# Patient Record
Sex: Female | Born: 1975 | Race: Black or African American | Hispanic: No | Marital: Single | Smoking: Former smoker
Health system: Southern US, Community
[De-identification: ages and names within clinical notes are randomized; demographics above are authoritative.]

## PROBLEM LIST (undated history)

## (undated) DIAGNOSIS — G43909 Migraine, unspecified, not intractable, without status migrainosus: Secondary | ICD-10-CM

## (undated) HISTORY — PX: HEMORRHOID SURGERY: SHX153

## (undated) HISTORY — PX: WISDOM TOOTH EXTRACTION: SHX21

---

## 1998-02-26 ENCOUNTER — Other Ambulatory Visit: Admission: RE | Admit: 1998-02-26 | Discharge: 1998-02-26 | Payer: Self-pay | Admitting: Family Medicine

## 1998-09-02 ENCOUNTER — Encounter: Admission: RE | Admit: 1998-09-02 | Discharge: 1998-09-02 | Payer: Self-pay | Admitting: Family Medicine

## 1998-09-06 ENCOUNTER — Encounter: Admission: RE | Admit: 1998-09-06 | Discharge: 1998-09-06 | Payer: Self-pay | Admitting: Family Medicine

## 1998-10-14 ENCOUNTER — Encounter: Admission: RE | Admit: 1998-10-14 | Discharge: 1998-10-14 | Payer: Self-pay | Admitting: Family Medicine

## 1998-10-28 ENCOUNTER — Encounter: Admission: RE | Admit: 1998-10-28 | Discharge: 1998-10-28 | Payer: Self-pay | Admitting: Family Medicine

## 1998-12-18 ENCOUNTER — Encounter: Admission: RE | Admit: 1998-12-18 | Discharge: 1998-12-18 | Payer: Self-pay | Admitting: Family Medicine

## 1998-12-23 ENCOUNTER — Encounter: Admission: RE | Admit: 1998-12-23 | Discharge: 1998-12-23 | Payer: Self-pay | Admitting: Family Medicine

## 1999-05-19 ENCOUNTER — Emergency Department (HOSPITAL_COMMUNITY): Admission: EM | Admit: 1999-05-19 | Discharge: 1999-05-19 | Payer: Self-pay | Admitting: Emergency Medicine

## 2000-08-20 ENCOUNTER — Encounter: Admission: RE | Admit: 2000-08-20 | Discharge: 2000-08-20 | Payer: Self-pay | Admitting: Family Medicine

## 2000-08-20 ENCOUNTER — Other Ambulatory Visit: Admission: RE | Admit: 2000-08-20 | Discharge: 2000-08-20 | Payer: Self-pay | Admitting: Family Medicine

## 2000-09-23 ENCOUNTER — Encounter (INDEPENDENT_AMBULATORY_CARE_PROVIDER_SITE_OTHER): Payer: Self-pay | Admitting: *Deleted

## 2000-09-23 ENCOUNTER — Ambulatory Visit (HOSPITAL_BASED_OUTPATIENT_CLINIC_OR_DEPARTMENT_OTHER): Admission: RE | Admit: 2000-09-23 | Discharge: 2000-09-24 | Payer: Self-pay | Admitting: *Deleted

## 2001-08-22 ENCOUNTER — Encounter: Admission: RE | Admit: 2001-08-22 | Discharge: 2001-08-22 | Payer: Self-pay | Admitting: Family Medicine

## 2001-08-25 ENCOUNTER — Encounter: Admission: RE | Admit: 2001-08-25 | Discharge: 2001-08-25 | Payer: Self-pay | Admitting: Family Medicine

## 2001-09-21 ENCOUNTER — Encounter: Admission: RE | Admit: 2001-09-21 | Discharge: 2001-09-21 | Payer: Self-pay | Admitting: Family Medicine

## 2001-09-23 ENCOUNTER — Encounter: Admission: RE | Admit: 2001-09-23 | Discharge: 2001-09-23 | Payer: Self-pay | Admitting: Sports Medicine

## 2001-09-23 ENCOUNTER — Encounter: Payer: Self-pay | Admitting: Sports Medicine

## 2001-10-22 ENCOUNTER — Emergency Department (HOSPITAL_COMMUNITY): Admission: EM | Admit: 2001-10-22 | Discharge: 2001-10-22 | Payer: Self-pay | Admitting: Emergency Medicine

## 2002-05-29 ENCOUNTER — Encounter: Admission: RE | Admit: 2002-05-29 | Discharge: 2002-05-29 | Payer: Self-pay | Admitting: Family Medicine

## 2002-05-29 ENCOUNTER — Other Ambulatory Visit: Admission: RE | Admit: 2002-05-29 | Discharge: 2002-05-29 | Payer: Self-pay | Admitting: Family Medicine

## 2003-01-17 ENCOUNTER — Encounter: Admission: RE | Admit: 2003-01-17 | Discharge: 2003-01-17 | Payer: Self-pay | Admitting: Family Medicine

## 2003-10-04 ENCOUNTER — Other Ambulatory Visit: Admission: RE | Admit: 2003-10-04 | Discharge: 2003-10-04 | Payer: Self-pay | Admitting: Family Medicine

## 2003-10-04 ENCOUNTER — Encounter: Admission: RE | Admit: 2003-10-04 | Discharge: 2003-10-04 | Payer: Self-pay | Admitting: Sports Medicine

## 2003-11-19 ENCOUNTER — Encounter: Admission: RE | Admit: 2003-11-19 | Discharge: 2003-11-19 | Payer: Self-pay | Admitting: Sports Medicine

## 2004-01-29 ENCOUNTER — Encounter: Admission: RE | Admit: 2004-01-29 | Discharge: 2004-01-29 | Payer: Self-pay | Admitting: Sports Medicine

## 2004-08-02 ENCOUNTER — Emergency Department (HOSPITAL_COMMUNITY): Admission: EM | Admit: 2004-08-02 | Discharge: 2004-08-02 | Payer: Self-pay | Admitting: Family Medicine

## 2004-09-09 ENCOUNTER — Other Ambulatory Visit: Admission: RE | Admit: 2004-09-09 | Discharge: 2004-09-09 | Payer: Self-pay | Admitting: Family Medicine

## 2004-09-09 ENCOUNTER — Ambulatory Visit: Payer: Self-pay | Admitting: Family Medicine

## 2004-11-19 ENCOUNTER — Ambulatory Visit: Payer: Self-pay | Admitting: Family Medicine

## 2004-11-21 ENCOUNTER — Ambulatory Visit: Payer: Self-pay | Admitting: Family Medicine

## 2005-04-26 ENCOUNTER — Emergency Department (HOSPITAL_COMMUNITY): Admission: EM | Admit: 2005-04-26 | Discharge: 2005-04-26 | Payer: Self-pay | Admitting: Emergency Medicine

## 2005-09-07 ENCOUNTER — Emergency Department (HOSPITAL_COMMUNITY): Admission: EM | Admit: 2005-09-07 | Discharge: 2005-09-08 | Payer: Self-pay | Admitting: Emergency Medicine

## 2005-09-24 ENCOUNTER — Encounter (INDEPENDENT_AMBULATORY_CARE_PROVIDER_SITE_OTHER): Payer: Self-pay | Admitting: *Deleted

## 2005-09-24 LAB — CONVERTED CEMR LAB

## 2005-10-12 ENCOUNTER — Ambulatory Visit: Payer: Self-pay | Admitting: Family Medicine

## 2005-12-07 ENCOUNTER — Ambulatory Visit: Payer: Self-pay | Admitting: Family Medicine

## 2006-04-07 ENCOUNTER — Ambulatory Visit: Payer: Self-pay | Admitting: Family Medicine

## 2006-10-21 DIAGNOSIS — L2089 Other atopic dermatitis: Secondary | ICD-10-CM

## 2006-10-22 ENCOUNTER — Encounter (INDEPENDENT_AMBULATORY_CARE_PROVIDER_SITE_OTHER): Payer: Self-pay | Admitting: *Deleted

## 2007-01-21 ENCOUNTER — Telehealth: Payer: Self-pay | Admitting: *Deleted

## 2007-03-21 ENCOUNTER — Encounter (INDEPENDENT_AMBULATORY_CARE_PROVIDER_SITE_OTHER): Payer: Self-pay | Admitting: Family Medicine

## 2007-03-21 ENCOUNTER — Other Ambulatory Visit: Admission: RE | Admit: 2007-03-21 | Discharge: 2007-03-21 | Payer: Self-pay | Admitting: Family Medicine

## 2007-03-21 ENCOUNTER — Ambulatory Visit: Payer: Self-pay | Admitting: Family Medicine

## 2007-03-21 DIAGNOSIS — R635 Abnormal weight gain: Secondary | ICD-10-CM | POA: Insufficient documentation

## 2007-03-21 LAB — CONVERTED CEMR LAB: Pap Smear: NORMAL

## 2007-03-24 ENCOUNTER — Telehealth (INDEPENDENT_AMBULATORY_CARE_PROVIDER_SITE_OTHER): Payer: Self-pay | Admitting: Family Medicine

## 2007-04-15 ENCOUNTER — Telehealth (INDEPENDENT_AMBULATORY_CARE_PROVIDER_SITE_OTHER): Payer: Self-pay | Admitting: Family Medicine

## 2007-06-02 ENCOUNTER — Ambulatory Visit: Payer: Self-pay | Admitting: Family Medicine

## 2007-06-02 DIAGNOSIS — E663 Overweight: Secondary | ICD-10-CM | POA: Insufficient documentation

## 2007-06-02 DIAGNOSIS — J309 Allergic rhinitis, unspecified: Secondary | ICD-10-CM | POA: Insufficient documentation

## 2007-06-06 ENCOUNTER — Telehealth (INDEPENDENT_AMBULATORY_CARE_PROVIDER_SITE_OTHER): Payer: Self-pay | Admitting: Family Medicine

## 2007-06-17 ENCOUNTER — Encounter (INDEPENDENT_AMBULATORY_CARE_PROVIDER_SITE_OTHER): Payer: Self-pay | Admitting: Family Medicine

## 2008-07-05 ENCOUNTER — Encounter: Payer: Self-pay | Admitting: Family Medicine

## 2008-07-05 ENCOUNTER — Ambulatory Visit: Payer: Self-pay | Admitting: Family Medicine

## 2008-07-05 DIAGNOSIS — R634 Abnormal weight loss: Secondary | ICD-10-CM

## 2008-07-05 DIAGNOSIS — K625 Hemorrhage of anus and rectum: Secondary | ICD-10-CM

## 2008-07-05 DIAGNOSIS — M25569 Pain in unspecified knee: Secondary | ICD-10-CM

## 2008-07-05 DIAGNOSIS — J020 Streptococcal pharyngitis: Secondary | ICD-10-CM

## 2008-07-05 DIAGNOSIS — B49 Unspecified mycosis: Secondary | ICD-10-CM

## 2008-07-15 ENCOUNTER — Encounter: Payer: Self-pay | Admitting: Family Medicine

## 2010-09-24 ENCOUNTER — Encounter: Payer: Self-pay | Admitting: *Deleted

## 2011-01-09 NOTE — Op Note (Signed)
[place]. [facility]  Patient:    Erica Burnett, Erica Burnett                          MRN: [id number] Proc. Date: 09/23/00 Adm. Date:  [id number] Attending:  Stephenie Acres                           Operative Report  PREOPERATIVE DIAGNOSIS:  Grade 2 to 3 internal hemorrhoids.  POSTOPERATIVE DIAGNOSIS:  Grade 2 to 3 internal hemorrhoids.  OPERATION PERFORMED:  A two-quadrant hemorrhoidectomy.  SURGEON:  Stephenie Acres, M.D.  ANESTHESIA:  General.  DESCRIPTION OF PROCEDURE:  The patient was taken to the operating room and placed in supine position.  After adequate anesthesia was induced using endotracheal tube, the patient was placed in the prone jackknife position. Perianal prep was undertaken.  Using 9 cc of Marcaine diluted with 1 cc of Wydase, the right posterior hemorrhoidal bundle which was very large was injected.  It was grasped and retracted to the anoderm.  An incision was made around the hemorrhoidal bundle and dissection was taken to remove the hemorrhoidal bundle to the underlying musculature.  The apex was grasped with a Bowie clamp and the hemorrhoidal bundle was removed.  The defect was closed in a mucosa to muscle to mucosa interlocking 2-0 chromic fashion up to the anoderm.  The defect in the anoderm was then closed with a 3-0 chromic suture. A similar procedure was used in the left lateral hemorrhoidal bundle.  All tissues were injected using 0.5% Marcaine.  A four-quadrant injection was also undertaken.  A large Gelfoam packing was placed in the rectum.  The patient tolerated the procedure well and went to PACU in good condition. DD:  09/23/00 TD:  09/24/00 Job: [id number]

## 2011-06-04 ENCOUNTER — Encounter: Payer: Self-pay | Admitting: Family Medicine

## 2011-06-16 ENCOUNTER — Encounter: Payer: Self-pay | Admitting: Family Medicine

## 2011-06-16 ENCOUNTER — Other Ambulatory Visit: Payer: Self-pay | Admitting: Family Medicine

## 2011-06-16 ENCOUNTER — Ambulatory Visit (INDEPENDENT_AMBULATORY_CARE_PROVIDER_SITE_OTHER): Payer: Managed Care, Other (non HMO) | Admitting: Family Medicine

## 2011-06-16 ENCOUNTER — Other Ambulatory Visit (HOSPITAL_COMMUNITY)
Admission: RE | Admit: 2011-06-16 | Discharge: 2011-06-16 | Disposition: A | Discharge status: Home / Self Care | Payer: Managed Care, Other (non HMO) | Source: Ambulatory Visit | Attending: Family Medicine | Admitting: Family Medicine

## 2011-06-16 DIAGNOSIS — Z13220 Encounter for screening for lipoid disorders: Secondary | ICD-10-CM

## 2011-06-16 DIAGNOSIS — Z01419 Encounter for gynecological examination (general) (routine) without abnormal findings: Secondary | ICD-10-CM | POA: Insufficient documentation

## 2011-06-16 DIAGNOSIS — Z23 Encounter for immunization: Secondary | ICD-10-CM

## 2011-06-16 DIAGNOSIS — Z124 Encounter for screening for malignant neoplasm of cervix: Secondary | ICD-10-CM

## 2011-06-16 DIAGNOSIS — N760 Acute vaginitis: Secondary | ICD-10-CM

## 2011-06-16 LAB — LIPID PANEL
Cholesterol: 161 mg/dL (ref 0–200)
HDL: 41 mg/dL (ref >39)
Total CHOL/HDL Ratio: 3.9 Ratio
Triglycerides: 60 mg/dL (ref <150)
VLDL: 12 mg/dL (ref 0–40)

## 2011-06-16 MED ORDER — IBUPROFEN 800 MG PO TABS: 800.0000 mg | ORAL_TABLET | Freq: Three times a day (TID) | ORAL | Status: AC | PRN

## 2011-06-16 MED ORDER — BETAMETHASONE VALERATE 0.1 % EX OINT: TOPICAL_OINTMENT | Freq: Every day | CUTANEOUS | Status: AC

## 2011-06-16 NOTE — Patient Instructions (Signed)

## 2011-06-16 NOTE — Progress Notes (Signed)
Subjective:    Patient ID: Erica Burnett, female    DOB: 1976/03/19, 35 y.o.   MRN: [id number]  HPI 1. CPE Doing well, complete history and physical performed.  2. 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 Between her web spaces on right foot. Pruritis noted.  3. Eczema - buttocks 2 week history of pruritic eczematous patch on right buttock. Using hydrocortisone with little relief. Uses a very drying soap: irish spring.  No evidence of infection or fungal rash.  4. Weight management Has a personal trainer, frequent exercise, diet plan. She has lost 40 lbs per pt.  5. HTN Diastolic HTN 90. She has no headache/visual changes. She has started to decrease her salt intake.  6. Tobacco abuse She quit smoking 3 years ago. However, two times a month when she drinks alcohol she smokes a few cigarettes. Counseled for 15 minutes  7. Influenza vaccine counseling Counseled for 10 minutes. Discussed the risks and benefits of having the vaccine. She will think about it. Does not want it now.  Review of Systems  Constitutional: Positive for activity change. Negative for fever, fatigue and unexpected weight change.  HENT: Negative for congestion and sore throat.   Eyes: Negative for visual disturbance.  Respiratory: Negative for shortness of breath.   Cardiovascular: Negative for chest pain and palpitations.  Gastrointestinal: Positive for blood in stool. Negative for nausea, abdominal pain, diarrhea, constipation, abdominal distention and rectal pain.  Genitourinary: Positive for vaginal discharge and vaginal pain. Negative for dysuria, vaginal bleeding and pelvic pain.  Musculoskeletal: Negative for myalgias.  Skin: Positive for rash.  Neurological: Negative for syncope.  Psychiatric/Behavioral: Negative for dysphoric mood.       Objective:   Physical Exam  Nursing note and vitals reviewed. Constitutional: She appears well-developed and well-nourished. No  distress.  HENT:  Head: Normocephalic and atraumatic.  Neck: Neck supple. No JVD present. No tracheal deviation present. No thyromegaly present.  Cardiovascular: Normal rate, regular rhythm and normal heart sounds.   No murmur heard. Pulmonary/Chest: Effort normal and breath sounds normal. No respiratory distress. She has no wheezes. She has no rales. She exhibits no tenderness.  Abdominal: Soft. She exhibits no distension and no mass. There is no tenderness. There is no rebound and no guarding.  Genitourinary: Vagina normal and uterus normal. No vaginal discharge found.  Musculoskeletal: She exhibits no edema.  Lymphadenopathy:    She has no cervical adenopathy.  Skin: Skin is warm. No rash noted. She is not diaphoretic.  Psychiatric: She has a normal mood and affect.       Assessment & Plan:  1. CPE Doing well, complete history and physical performed.  2. 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 Between her web spaces on right foot. Pruritis noted.  3. Eczema - buttocks 2 week history of pruritic eczematous patch on right buttock. Using hydrocortisone with little relief. Uses a very drying soap: irish spring.  No evidence of infection or fungal rash.  4. Weight management Has a personal trainer, frequent exercise, diet plan. She has lost 40 lbs per pt.  5. HTN Diastolic HTN 90. She has no headache/visual changes. She has started to decrease her salt intake.  6. Tobacco abuse She quit smoking 3 years ago. However, two times a month when she drinks alcohol she smokes a few cigarettes. Counseled for 15 minutes  7. Influenza vaccine counseling Counseled for 10 minutes. Discussed the risks and benefits of having the vaccine.  She will think about it. Does not want it now.

## 2011-06-16 NOTE — Telephone Encounter (Signed)
Left message about WET prep results

## 2011-06-17 ENCOUNTER — Telehealth: Payer: Self-pay | Admitting: Family Medicine

## 2011-06-17 NOTE — Telephone Encounter (Signed)
Discussed lipid panel results with patient. Plan to continue diet modification and exercise

## 2011-06-19 ENCOUNTER — Encounter: Payer: Self-pay | Admitting: Family Medicine

## 2013-01-18 ENCOUNTER — Emergency Department (HOSPITAL_BASED_OUTPATIENT_CLINIC_OR_DEPARTMENT_OTHER)
Admission: EM | Admit: 2013-01-18 | Discharge: 2013-01-18 | Disposition: A | Discharge status: Home / Self Care | Payer: Managed Care, Other (non HMO) | Attending: Emergency Medicine | Admitting: Emergency Medicine

## 2013-01-18 ENCOUNTER — Encounter (HOSPITAL_BASED_OUTPATIENT_CLINIC_OR_DEPARTMENT_OTHER): Payer: Self-pay | Admitting: Family Medicine

## 2013-01-18 DIAGNOSIS — F17200 Nicotine dependence, unspecified, uncomplicated: Secondary | ICD-10-CM | POA: Insufficient documentation

## 2013-01-18 DIAGNOSIS — L309 Dermatitis, unspecified: Secondary | ICD-10-CM

## 2013-01-18 DIAGNOSIS — L259 Unspecified contact dermatitis, unspecified cause: Secondary | ICD-10-CM | POA: Insufficient documentation

## 2013-01-18 MED ORDER — CLOTRIMAZOLE-BETAMETHASONE 1-0.05 % EX CREA: TOPICAL_CREAM | CUTANEOUS | Status: DC

## 2013-01-18 NOTE — ED Notes (Signed)
Pt c/o unknown rash to right hand x wks.

## 2013-01-18 NOTE — ED Provider Notes (Signed)
History     CSN: [id number]  Arrival date & time 01/18/13  0806   None     No chief complaint on file.   (Consider location/radiation/quality/duration/timing/severity/associated sxs/prior treatment) HPI Comments: Patient presents with complaints of rash to the back of her right hand for the past two weeks.  It is itchy and has popped up in several places.  She has tried otc cortisone cream which has not helped.  Patient is a 37 y.o. female presenting with rash. The history is provided by the patient.  Rash Pain location: right hand. Pain quality comment:  No pain Pain severity:  No pain Onset quality:  Gradual Duration:  2 weeks Timing:  Constant Progression:  Worsening Chronicity:  New Relieved by:  Nothing Ineffective treatments: otc hydrocortisone.   History reviewed. No pertinent past medical history.  History reviewed. No pertinent past surgical history.  No family history on file.  History  Substance Use Topics  . Smoking status: Current Some Day Smoker    Types: Cigarettes  . Smokeless tobacco: Former Neurosurgeon    Quit date: 06/16/2007  . Alcohol Use: No    OB History   Grav Para Term Preterm Abortions TAB SAB Ect Mult Living                  Review of Systems  Skin: Positive for rash.  All other systems reviewed and are negative.    Allergies  Review of patient's allergies indicates no known allergies.  Home Medications   Current Outpatient Rx  Name  Route  Sig  Dispense  Refill  . fluticasone (VERAMYST) 27.5 MCG/SPRAY nasal spray   Nasal   1 spray by Nasal route daily.           [name] loratadine (CLARITIN) 10 MG tablet   Oral   Take 10 mg by mouth daily.             BP 129/79  Pulse 76  Temp(Src) 98.5 F (36.9 C) (Oral)  Resp 18  Ht 5' (1.524 m)  Wt 160 lb (72.576 kg)  BMI 31.25 kg/m2  SpO2 100%  LMP 12/19/2012  Physical Exam  Nursing note and vitals reviewed. Constitutional: She is oriented to person, place, and time. She  appears well-developed and well-nourished. No distress.  HENT:  Head: Normocephalic and atraumatic.  Mouth/Throat: Oropharynx is clear and moist.  Neck: Normal range of motion. Neck supple.  Neurological: She is alert and oriented to person, place, and time.  Skin: She is not diaphoretic.  There are several 1 cm round, annular, raised lesions to the dorsum of the right hand.  They are pruritic in nature.  No drainage or erythema.    ED Course  Procedures (including critical care time)  Labs Reviewed - No data to display No results found.   No diagnosis found.    MDM  Possible fungal etiology.  Will treat with lotrisone, follow up prn.        Geoffery Lyons, MD 01/18/13 [phone]

## 2013-03-03 ENCOUNTER — Encounter (HOSPITAL_BASED_OUTPATIENT_CLINIC_OR_DEPARTMENT_OTHER): Payer: Self-pay | Admitting: Family Medicine

## 2013-03-03 ENCOUNTER — Emergency Department (HOSPITAL_BASED_OUTPATIENT_CLINIC_OR_DEPARTMENT_OTHER): Payer: Managed Care, Other (non HMO)

## 2013-03-03 ENCOUNTER — Emergency Department (HOSPITAL_BASED_OUTPATIENT_CLINIC_OR_DEPARTMENT_OTHER)
Admission: EM | Admit: 2013-03-03 | Discharge: 2013-03-03 | Disposition: A | Discharge status: Home / Self Care | Payer: Managed Care, Other (non HMO) | Attending: Emergency Medicine | Admitting: Emergency Medicine

## 2013-03-03 DIAGNOSIS — R071 Chest pain on breathing: Secondary | ICD-10-CM | POA: Insufficient documentation

## 2013-03-03 DIAGNOSIS — R1013 Epigastric pain: Secondary | ICD-10-CM | POA: Insufficient documentation

## 2013-03-03 DIAGNOSIS — R110 Nausea: Secondary | ICD-10-CM | POA: Insufficient documentation

## 2013-03-03 DIAGNOSIS — F17200 Nicotine dependence, unspecified, uncomplicated: Secondary | ICD-10-CM | POA: Insufficient documentation

## 2013-03-03 DIAGNOSIS — R0789 Other chest pain: Secondary | ICD-10-CM

## 2013-03-03 LAB — COMPREHENSIVE METABOLIC PANEL
ALT: 11 U/L (ref 0–35)
AST: 12 U/L (ref 0–37)
Albumin: 4 g/dL (ref 3.5–5.2)
Calcium: 10.8 mg/dL — ABNORMAL HIGH (ref 8.4–10.5)
GFR calc Af Amer: >90 mL/min (ref >90)
Sodium: 138 mEq/L (ref 135–145)
Total Protein: 7.9 g/dL (ref 6.0–8.3)

## 2013-03-03 LAB — CBC WITH DIFFERENTIAL/PLATELET
Basophils Absolute: 0 10*3/uL (ref 0.0–0.1)
Basophils Relative: 1 % (ref 0–1)
Eosinophils Absolute: 0.1 10*3/uL (ref 0.0–0.7)
Eosinophils Relative: 2 % (ref 0–5)
MCH: 26.9 pg (ref 26.0–34.0)
MCV: 80 fL (ref 78.0–100.0)
Platelets: 327 10*3/uL (ref 150–400)
RDW: 14.5 % (ref 11.5–15.5)
WBC: 7.4 10*3/uL (ref 4.0–10.5)

## 2013-03-03 MED ORDER — LANSOPRAZOLE 30 MG PO CPDR: 30.0000 mg | DELAYED_RELEASE_CAPSULE | Freq: Every day | ORAL | Status: DC

## 2013-03-03 MED ORDER — ASPIRIN 81 MG PO CHEW
324.0000 mg | CHEWABLE_TABLET | Freq: Once | ORAL | Status: AC
  Administered 2013-03-03: 324 mg via ORAL
  Filled 2013-03-03: qty 4

## 2013-03-03 MED ORDER — GI COCKTAIL ~~LOC~~
30.0000 mL | Freq: Once | ORAL | Status: DC
  Filled 2013-03-03: qty 30

## 2013-03-03 MED ORDER — HYDROCODONE-ACETAMINOPHEN 5-325 MG PO TABS: 2.0000 | ORAL_TABLET | Freq: Four times a day (QID) | ORAL | Status: DC | PRN

## 2013-03-03 NOTE — Discharge Instructions (Signed)
Abdominal Pain (Nonspecific) Your exam might not show the exact reason you have abdominal pain. Since there are many different causes of abdominal pain, another checkup and more tests may be needed. It is very important to follow up for lasting (persistent) or worsening symptoms. A possible cause of abdominal pain in any person who still has his or her appendix is acute appendicitis. Appendicitis is often hard to diagnose. Normal blood tests, urine tests, ultrasound, and CT scans do not completely rule out early appendicitis or other causes of abdominal pain. Sometimes, only the changes that happen over time will allow appendicitis and other causes of abdominal pain to be determined. Other potential problems that may require surgery may also take time to become more apparent. Because of this, it is important that you follow all of the instructions below. HOME CARE INSTRUCTIONS   Rest as much as possible.  Do not eat solid food until your pain is gone.  While adults or children have pain: A diet of water, weak decaffeinated tea, broth or bouillon, gelatin, oral rehydration solutions (ORS), frozen ice pops, or ice chips may be helpful.  When pain is gone in adults or children: Start a light diet (dry toast, crackers, applesauce, or white rice). Increase the diet slowly as long as it does not bother you. Eat no dairy products (including cheese and eggs) and no spicy, fatty, fried, or high-fiber foods.  Use no alcohol, caffeine, or cigarettes.  Take your regular medicines unless your caregiver told you not to.  Take any prescribed medicine as directed.  Only take over-the-counter or prescription medicines for pain, discomfort, or fever as directed by your caregiver. Do not give aspirin to children. If your caregiver has given you a follow-up appointment, it is very important to keep that appointment. Not keeping the appointment could result in a permanent injury and/or lasting (chronic) pain and/or  disability. If there is any problem keeping the appointment, you must call to reschedule.  SEEK IMMEDIATE MEDICAL CARE IF:   Your pain is not gone in 24 hours.  Your pain becomes worse, changes location, or feels different.  You or your child has an oral temperature above 102 F (38.9 C), not controlled by medicine.  Your baby is older than 3 months with a rectal temperature of 102 F (38.9 C) or higher.  Your baby is 75 months old or younger with a rectal temperature of 100.4 F (38 C) or higher.  You have shaking chills.  You keep throwing up (vomiting) or cannot drink liquids.  There is blood in your vomit or you see blood in your bowel movements.  Your bowel movements become dark or black.  You have frequent bowel movements.  Your bowel movements stop (become blocked) or you cannot pass gas.  You have bloody, frequent, or painful urination.  You have yellow discoloration in the skin or whites of the eyes.  Your stomach becomes bloated or bigger.  You have dizziness or fainting.  You have chest or back pain. MAKE SURE YOU:   Understand these instructions.  Will watch your condition.  Will get help right away if you are not doing well or get worse. Document Released: 08/10/2005 Document Revised: 11/02/2011 Document Reviewed: 07/08/2009 [facility] Patient Information 2014 [place], [place].  Chest Wall Pain Chest wall pain is pain in or around the bones and muscles of your chest. It may take up to 6 weeks to get better. It may take longer if you must stay physically active in  your work and activities.  CAUSES  Chest wall pain may happen on its own. However, it may be caused by:  A viral illness like the flu.  Injury.  Coughing.  Exercise.  Arthritis.  Fibromyalgia.  Shingles. HOME CARE INSTRUCTIONS   Avoid overtiring physical activity. Try not to strain or perform activities that cause pain. This includes any activities using your chest or your abdominal  and side muscles, especially if heavy weights are used.  Put ice on the sore area.  Put ice in a plastic bag.  Place a towel between your skin and the bag.  Leave the ice on for 15-20 minutes per hour while awake for the first 2 days.  Only take over-the-counter or prescription medicines for pain, discomfort, or fever as directed by your caregiver. SEEK IMMEDIATE MEDICAL CARE IF:   Your pain increases, or you are very uncomfortable.  You have a fever.  Your chest pain becomes worse.  You have new, unexplained symptoms.  You have nausea or vomiting.  You feel sweaty or lightheaded.  You have a cough with phlegm (sputum), or you cough up blood. MAKE SURE YOU:   Understand these instructions.  Will watch your condition.  Will get help right away if you are not doing well or get worse. Document Released: 08/10/2005 Document Revised: 11/02/2011 Document Reviewed: 04/06/2011 [facility] Patient Information 2014 [place], [place].

## 2013-03-03 NOTE — ED Provider Notes (Signed)
History    CSN: [id number] Arrival date & time 03/03/13  1229  First MD Initiated Contact with Patient 03/03/13 1254     Chief Complaint  Patient presents with  . Chest Pain   (Consider location/radiation/quality/duration/timing/severity/associated sxs/prior Treatment) Patient is a 37 y.o. female presenting with chest pain.  Chest Pain  Pt reports 3 days of aching pain in lower chest/xyphoid and epigastric area, initially she thought it was gas and had some improvement with OTC remedies. She reports pain has been worse since yesterday in particular with deep breath and movement. No change with eating. Occasional nausea, but no vomiting, diarrhea or fever. She has some improvement with lying prone in the bed too. No known CAD. No HTN, DM. PERC neg.   History reviewed. No pertinent past medical history. History reviewed. No pertinent past surgical history. No family history on file. History  Substance Use Topics  . Smoking status: Current Some Day Smoker    Types: Cigarettes  . Smokeless tobacco: Former Neurosurgeon    Quit date: 06/16/2007  . Alcohol Use: No   OB History   Grav Para Term Preterm Abortions TAB SAB Ect Mult Living                 Review of Systems  Cardiovascular: Positive for chest pain.   All other systems reviewed and are negative except as noted in HPI.   Allergies  Review of patient's allergies indicates no known allergies.  Home Medications   Current Outpatient Rx  Name  Route  Sig  Dispense  Refill  . clotrimazole-betamethasone (LOTRISONE) cream      Apply to affected area 2 times daily prn   15 g   1   . fluticasone (VERAMYST) 27.5 MCG/SPRAY nasal spray   Nasal   1 spray by Nasal route daily.           [name] loratadine (CLARITIN) 10 MG tablet   Oral   Take 10 mg by mouth daily.            BP 138/86  Pulse 98  Temp(Src) 98.1 F (36.7 C) (Oral)  Resp 20  Ht 5' (1.524 m)  Wt 160 lb (72.576 kg)  BMI 31.25 kg/m2  SpO2 100%  LMP  02/19/2013 Physical Exam  Nursing note and vitals reviewed. Constitutional: She is oriented to person, place, and time. She appears well-developed and well-nourished.  HENT:  Head: Normocephalic and atraumatic.  Eyes: EOM are normal. Pupils are equal, round, and reactive to light.  Neck: Normal range of motion. Neck supple.  Cardiovascular: Normal rate, normal heart sounds and intact distal pulses.   Pulmonary/Chest: Effort normal and breath sounds normal. She has no wheezes. She has no rales. She exhibits no tenderness.  Abdominal: Bowel sounds are normal. She exhibits no distension. There is tenderness (mild, epigastric). There is no rebound and no guarding.  Musculoskeletal: Normal range of motion. She exhibits no edema and no tenderness.  Neurological: She is alert and oriented to person, place, and time. She has normal strength. No cranial nerve deficit or sensory deficit.  Skin: Skin is warm and dry. No rash noted.  Psychiatric: She has a normal mood and affect.    ED Course  Procedures (including critical care time) Labs Reviewed  COMPREHENSIVE METABOLIC PANEL - Abnormal; Notable for the following:    Calcium 10.8 (*)    All other components within normal limits  CBC WITH DIFFERENTIAL  LIPASE, BLOOD  TROPONIN I   Dg  Abd Acute W/chest  03/03/2013   *RADIOLOGY REPORT*  Clinical Data: Epigastric pain, chest pain  ACUTE ABDOMEN SERIES (ABDOMEN 2 VIEW & CHEST 1 VIEW)  Comparison: None.  Findings: Cardiomediastinal silhouette is unremarkable.  No acute infiltrate or pleural effusion.  No pulmonary edema.  There is nonspecific nonobstructive bowel gas pattern.  Moderate stool noted in the right colon and left colon.  Pelvic phleboliths are noted.  No free abdominal air.  IMPRESSION: 1.  No acute disease.  Nonspecific nonobstructive bowel gas pattern.  No free abdominal air.   Original Report Authenticated By: Natasha Mead, M.D.   1. Chest wall pain   2. Epigastric pain     MDM   Date:  03/03/2013  Rate: 86  Rhythm: normal sinus rhythm  QRS Axis: normal  Intervals: normal  ST/T Wave abnormalities: normal  Conduction Disutrbances:none  Narrative Interpretation:   Old EKG Reviewed: none available  Labs and imaging reviewed and normal. Pt having lower chest wall vs epigastric pain from unclear etiology but unlikely to be ACS/CAD, PE or acute surgical process. Will d/c with pain meds and PPI, Advised PCP followup.     Charles B. Bernette Mayers, MD 03/03/13 1422

## 2013-03-03 NOTE — ED Notes (Signed)
Pt c/o generalized chest pain x 3 days, denies shob, n/v. Pt sts pain worse with movement and pain relieved when lying flat.

## 2014-06-19 ENCOUNTER — Telehealth: Payer: Self-pay | Admitting: Family Medicine

## 2014-06-19 ENCOUNTER — Other Ambulatory Visit (HOSPITAL_COMMUNITY)
Admission: RE | Admit: 2014-06-19 | Discharge: 2014-06-19 | Disposition: A | Discharge status: Home / Self Care | Payer: Managed Care, Other (non HMO) | Source: Ambulatory Visit | Attending: Family Medicine | Admitting: Family Medicine

## 2014-06-19 ENCOUNTER — Encounter: Payer: Self-pay | Admitting: Family Medicine

## 2014-06-19 ENCOUNTER — Ambulatory Visit (INDEPENDENT_AMBULATORY_CARE_PROVIDER_SITE_OTHER): Payer: Managed Care, Other (non HMO) | Admitting: Family Medicine

## 2014-06-19 VITALS — BP 142/87 | HR 98 | Temp 98.3°F | Ht 60.0 in | Wt 202.0 lb

## 2014-06-19 DIAGNOSIS — Z01419 Encounter for gynecological examination (general) (routine) without abnormal findings: Secondary | ICD-10-CM | POA: Insufficient documentation

## 2014-06-19 DIAGNOSIS — Z0000 Encounter for general adult medical examination without abnormal findings: Secondary | ICD-10-CM | POA: Insufficient documentation

## 2014-06-19 DIAGNOSIS — Z1151 Encounter for screening for human papillomavirus (HPV): Secondary | ICD-10-CM | POA: Diagnosis present

## 2014-06-19 DIAGNOSIS — R202 Paresthesia of skin: Secondary | ICD-10-CM | POA: Insufficient documentation

## 2014-06-19 DIAGNOSIS — N898 Other specified noninflammatory disorders of vagina: Secondary | ICD-10-CM

## 2014-06-19 DIAGNOSIS — L309 Dermatitis, unspecified: Secondary | ICD-10-CM

## 2014-06-19 DIAGNOSIS — Z131 Encounter for screening for diabetes mellitus: Secondary | ICD-10-CM

## 2014-06-19 LAB — CBC WITH DIFFERENTIAL/PLATELET
Basophils Absolute: 0 10*3/uL (ref 0.0–0.1)
Basophils Relative: 0 % (ref 0–1)
Eosinophils Absolute: 0.2 10*3/uL (ref 0.0–0.7)
Eosinophils Relative: 3 % (ref 0–5)
HEMATOCRIT: 34.4 % — AB (ref 36.0–46.0)
HEMOGLOBIN: 11.1 g/dL — AB (ref 12.0–15.0)
LYMPHS ABS: 2 10*3/uL (ref 0.7–4.0)
Lymphocytes Relative: 32 % (ref 12–46)
MCH: 25.2 pg — ABNORMAL LOW (ref 26.0–34.0)
MCHC: 32.3 g/dL (ref 30.0–36.0)
MCV: 78.2 fL (ref 78.0–100.0)
MONO ABS: 0.4 10*3/uL (ref 0.1–1.0)
MONOS PCT: 6 % (ref 3–12)
NEUTROS ABS: 3.8 10*3/uL (ref 1.7–7.7)
NEUTROS PCT: 59 % (ref 43–77)
Platelets: 371 10*3/uL (ref 150–400)
RBC: 4.4 MIL/uL (ref 3.87–5.11)
RDW: 15.4 % (ref 11.5–15.5)
WBC: 6.4 10*3/uL (ref 4.0–10.5)

## 2014-06-19 LAB — LIPID PANEL
Cholesterol: 152 mg/dL (ref 0–200)
HDL: 39 mg/dL — AB (ref >39)
LDL CALC: 96 mg/dL (ref 0–99)
TRIGLYCERIDES: 86 mg/dL (ref <150)
Total CHOL/HDL Ratio: 3.9 Ratio
VLDL: 17 mg/dL (ref 0–40)

## 2014-06-19 LAB — BASIC METABOLIC PANEL
BUN: 8 mg/dL (ref 6–23)
CALCIUM: 8.8 mg/dL (ref 8.4–10.5)
CHLORIDE: 104 meq/L (ref 96–112)
CO2: 22 meq/L (ref 19–32)
CREATININE: 0.66 mg/dL (ref 0.50–1.10)
GLUCOSE: 113 mg/dL — AB (ref 70–99)
Potassium: 4.1 mEq/L (ref 3.5–5.3)
SODIUM: 138 meq/L (ref 135–145)

## 2014-06-19 LAB — TSH: TSH: 0.688 u[IU]/mL (ref 0.350–4.500)

## 2014-06-19 LAB — POCT WET PREP (WET MOUNT): CLUE CELLS WET PREP WHIFF POC: POSITIVE

## 2014-06-19 LAB — VITAMIN B12: Vitamin B-12: 562 pg/mL (ref 211–911)

## 2014-06-19 LAB — POCT GLYCOSYLATED HEMOGLOBIN (HGB A1C): HEMOGLOBIN A1C: 5.5

## 2014-06-19 MED ORDER — CLOBETASOL PROPIONATE 0.05 % EX CREA: 1.0000 "application " | TOPICAL_CREAM | Freq: Two times a day (BID) | CUTANEOUS | Status: DC

## 2014-06-19 MED ORDER — IBUPROFEN 800 MG PO TABS: 800.0000 mg | ORAL_TABLET | Freq: Three times a day (TID) | ORAL | Status: DC | PRN

## 2014-06-19 MED ORDER — METRONIDAZOLE 500 MG PO TABS
500.0000 mg | ORAL_TABLET | Freq: Two times a day (BID) | ORAL | Status: AC
Start: 2014-06-19 — End: 2014-06-29

## 2014-06-19 NOTE — Assessment & Plan Note (Addendum)
Wet prep done showing BV and Trichomonas. Will treat with Metronidazole  for 7 days to cover both BV and Trich I called to discuss result with her message left for her to call back.

## 2014-06-19 NOTE — Progress Notes (Signed)
Patient ID: Erica Burnett, female   DOB: 1976-01-08, 38 y.o.   MRN: [id number] Subjective:     Erica Burnett is a 38 y.o. female and is here for a comprehensive physical exam. The patient reports problems - Skin rash on arm, hand, legs b/L for more than 1 year on and off, she had used ttopical steroid in the past. B/L upper and lower limb numbness and tingling mostly at night she is fine during the day and vaginal discharge.[name]  History   Social History  . Marital Status: Single    Spouse Name: N/A    Number of Children: N/A  . Years of Education: N/A   Occupational History  . Not on file.   Social History Main Topics  . Smoking status: Former Smoker    Types: Cigarettes  . Smokeless tobacco: Former Systems developer    Quit date: 06/16/2007  . Alcohol Use: No  . Drug Use: No  . Sexual Activity: Not on file   Other Topics Concern  . Not on file   Social History Narrative  . No narrative on file   Health Maintenance  Topic Date Due  . Pap Smear  06/15/2014  . Influenza Vaccine  11/22/2014 (Originally 03/24/2014)  . Tetanus/tdap  06/15/2021  OB/GYN Hx Patient's last menstrual period was 06/07/2014. Contraception: none .At times uses condoms.  Last Pap: 2012. Results were: normal Last mammogram: none, scheduled for next week. Results were: NA G3P2  The following portions of the patient's history were reviewed and updated as appropriate: allergies, current medications, past family history, past medical history, past social history, past surgical history and problem list.  Review of Systems Pertinent items are noted in HPI.   Objective:    BP 142/87  Pulse 98  Temp(Src) 98.3 F (36.8 C) (Oral)  Ht 5' (1.524 m)  Wt 202 lb (91.627 kg)  BMI 39.45 kg/m2  LMP 06/07/2014 General appearance: alert and cooperative Head: Normocephalic, without obvious abnormality, atraumatic Eyes: conjunctivae/corneas clear. PERRL, EOM's intact. Fundi benign. Ears: normal TM's and external ear canals both  ears and Right ear cerumen impaction otherwise normal. Nose: Nares normal. Septum midline. Mucosa normal. No drainage or sinus tenderness. Throat: lips, mucosa, and tongue normal; teeth and gums normal Neck: no adenopathy, no carotid bruit, no JVD, supple, symmetrical, trachea midline and thyroid not enlarged, symmetric, no tenderness/mass/nodules Lungs: clear to auscultation bilaterally Heart: regular rate and rhythm, S1, S2 normal, no murmur, click, rub or gallop Abdomen: soft, non-tender; bowel sounds normal; no masses,  no organomegaly Pelvic: cervix normal in appearance, external genitalia normal, no adnexal masses or tenderness, no cervical motion tenderness, rectovaginal septum normal, uterus normal size, shape, and consistency and whitish vaginal discharge Extremities: extremities normal, atraumatic, no cyanosis or edema Skin: Dry scaly,patchy hyperpigmented lesion on forearm, dorsum of her hand B/L, her left and right shin Neurologic: Alert and oriented X 3, normal strength and tone. Normal symmetric reflexes. Normal coordination and gait    Assessment:    Healthy female exam. PAP done Skin rash Paresthesia Vaginal discharge      Plan:     See After Visit Summary for Counseling Recommendations  Check problem list.

## 2014-06-19 NOTE — Progress Notes (Deleted)
Patient ID: Erica Burnett, female   DOB: March 05, 1976, 38 y.o.   MRN: [id number] Subjective:     Erica Burnett is a 38 y.o. female here for a routine exam.  Current complaints: Skin rash for 1 year. Numbness every night for the last few month Personal health questionnaire reviewed: yes.   Gynecologic History Patient's last menstrual period was 06/07/2014. Contraception: none .At times uses condoms.  Last Pap: 2012. Results were: normal Last mammogram: none, scheduled for next week. Results were: NA G3P2  Obstetric History OB History  No data available     The following portions of the patient's history were reviewed and updated as appropriate: allergies, current medications, past family history, past medical history, past social history, past surgical history and problem list.  Review of Systems {ros; complete:30496}    Objective:    {exam; complete:18323}    Assessment:    Healthy female exam.    Plan:    {plan:19193}

## 2014-06-19 NOTE — Assessment & Plan Note (Signed)
Unspecific rash. 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 Will try high potency steroid. I recommended biopsy if no improvement at the derm clinic.

## 2014-06-19 NOTE — Assessment & Plan Note (Addendum)
Normal exam except for skin rash, vaginal discharge. PAP done today, I will call her with result. GC/Chlamydia done since she is sexually active. Birth control counseling done, she declined birth Bernie Covey prefers condoms. She declined flu shot. She will schedule her mammogram within few days. FLP and BMET checked today.

## 2014-06-19 NOTE — Patient Instructions (Signed)
Pap Test A Pap test is a procedure done in a clinic office to evaluate cells that are on the surface of the cervix. The cervix is the lower portion of the uterus and upper portion of the vagina. For some women, the cervical region has the potential to form cancer. With consistent evaluations by your caregiver, this type of cancer can be prevented.  If a Pap test is abnormal, it is most often a result of a previous exposure to human papillomavirus (HPV). HPV is a virus that can infect the cells of the cervix and cause dysplasia. Dysplasia is where the cells no longer look normal. If a woman has been diagnosed with high-grade or severe dysplasia, they are at higher risk of developing cervical cancer. People diagnosed with low-grade dysplasia should still be seen by their caregiver because there is a small chance that low-grade dysplasia could develop into cancer.  LET YOUR CAREGIVER KNOW ABOUT:  Recent sexually transmitted infection (STI) you have had.  Any new sex partners you have had.  History of previous abnormal Pap tests results.  History of previous cervical procedures you have had (colposcopy, biopsy, loop electrosurgical excision procedure [LEEP]).  Concerns you have had regarding unusual vaginal discharge.  History of pelvic pain.  Your use of birth control. BEFORE THE PROCEDURE  Ask your caregiver when to schedule your Pap test. It is best not to be on your period if your caregiver uses a wooden spatula to collect cells or applies cells to a glass slide. Newer techniques are not so sensitive to the timing of a menstrual cycle.  Do not douche or have sexual intercourse for 24 hours before the test.   Do not use vaginal creams or tampons for 24 hours before the test.   Empty your bladder just before the test to lessen any discomfort.  PROCEDURE You will lie on an exam table with your feet in stirrups. A warm metal or plastic instrument (speculum) is placed in your vagina. This  instrument allows your caregiver to see the inside of your vagina and look at your cervix. A small, plastic brush or wooden spatula is then used to collect cervical cells. These cells are placed in a lab specimen container. The cells are looked at under a microscope. A specialist will determine if the cells are normal.  AFTER THE PROCEDURE Make sure to get your test results.If your results come back abnormal, you may need further testing.  Document Released: 10/31/2002 Document Revised: 11/02/2011 Document Reviewed: 08/06/2011 ExitCare Patient Information 2015 [employer]. This information is not intended to replace advice given to you by your health care provider. Make sure you discuss any questions you have with your health care provider.  

## 2014-06-19 NOTE — Assessment & Plan Note (Signed)
No sensory deficit on exam. R/O Metabolic cause. BMET, TSH, CBC, Vit B12 and D checked today. A1C also checked to screen for DM. I will call with test result and discuss further management.

## 2014-06-19 NOTE — Telephone Encounter (Signed)
I called about her wet prep which was positive for BV and Trichomoniasis, Instruction given to call back and or pick up prescription at the pharmacy.

## 2014-06-20 ENCOUNTER — Other Ambulatory Visit: Payer: Self-pay | Admitting: Family Medicine

## 2014-06-20 LAB — CYTOLOGY - PAP

## 2014-06-20 LAB — VITAMIN D 25 HYDROXY (VIT D DEFICIENCY, FRACTURES): VIT D 25 HYDROXY: 14 ng/mL — AB (ref 30–89)

## 2014-06-20 MED ORDER — VITAMIN D (ERGOCALCIFEROL) 1.25 MG (50000 UNIT) PO CAPS: 50000.0000 [IU] | ORAL_CAPSULE | ORAL | Status: DC

## 2014-06-20 NOTE — Telephone Encounter (Signed)
Discussed test result. Trichomoniasis and BV as well as low Vit. Treatment recommendation discussed with her. All questions were answered.

## 2014-06-21 ENCOUNTER — Encounter: Payer: Self-pay | Admitting: Family Medicine

## 2014-06-26 ENCOUNTER — Telehealth: Payer: Self-pay | Admitting: Family Medicine

## 2014-06-26 ENCOUNTER — Other Ambulatory Visit: Payer: Self-pay | Admitting: Family Medicine

## 2014-06-26 MED ORDER — CLOTRIMAZOLE-BETAMETHASONE 1-0.05 % EX CREA: 1.0000 "application " | TOPICAL_CREAM | Freq: Two times a day (BID) | CUTANEOUS | Status: DC

## 2014-06-26 NOTE — Telephone Encounter (Signed)
Pt seen by [place] on 10/27. Was prescribed TEMOVATE 0.05 % and it cost $126. Patient requesting an alternative med to be called in. Was prescribed LOTRISONE cream some time ago, and it only cost $18. Please make patient aware once new meds are sent.

## 2014-06-26 NOTE — Telephone Encounter (Signed)
Please inform patient her refill has been completed.

## 2014-06-26 NOTE — Telephone Encounter (Signed)
LMOVM "Dr. Gwendlyn Deutscher changed the Rx as requested". Monia Timmers, Salome Spotted

## 2014-07-02 ENCOUNTER — Telehealth: Payer: Self-pay | Admitting: Family Medicine

## 2014-07-02 NOTE — Telephone Encounter (Signed)
Pt called because the creams called in for her skin issues is very expensive. Can we call in something on the 4.00 list at [facility]. jw

## 2014-07-03 ENCOUNTER — Other Ambulatory Visit: Payer: Self-pay | Admitting: Family Medicine

## 2014-07-03 MED ORDER — TRIAMCINOLONE ACETONIDE 0.1 % EX CREA: 1.0000 "application " | TOPICAL_CREAM | Freq: Two times a day (BID) | CUTANEOUS | Status: DC

## 2014-07-03 NOTE — Telephone Encounter (Signed)
I have e-scribed triamcinolone. Please let patient know this.

## 2014-07-04 NOTE — Telephone Encounter (Signed)
LMOVM informing pt that "rx was changed again, please give [place] a call back is there is still an issue". Clinton Sawyer, Salome Spotted

## 2015-06-20 ENCOUNTER — Other Ambulatory Visit: Payer: Self-pay | Admitting: Family Medicine

## 2015-11-26 ENCOUNTER — Telehealth: Payer: Self-pay | Admitting: Family Medicine

## 2015-11-26 NOTE — Telephone Encounter (Signed)
Ms. Erica Burnett is not pleased that she have to be reassigned to a resident.  Want to speak with you regarding this.

## 2015-12-02 ENCOUNTER — Other Ambulatory Visit: Payer: Self-pay | Admitting: *Deleted

## 2015-12-02 MED ORDER — TRIAMCINOLONE ACETONIDE 0.1 % EX CREA: 1.0000 "application " | TOPICAL_CREAM | Freq: Two times a day (BID) | CUTANEOUS | Status: DC

## 2015-12-02 NOTE — Telephone Encounter (Signed)
Patient called stating she is having a bad flare up of eczema on her hands.  She has an upcoming well woman exam on 12/18/2015 with Dr. Ardelia Mems.  Derl Barrow, RN

## 2015-12-18 ENCOUNTER — Ambulatory Visit (INDEPENDENT_AMBULATORY_CARE_PROVIDER_SITE_OTHER): Payer: Managed Care, Other (non HMO) | Admitting: Family Medicine

## 2015-12-18 ENCOUNTER — Encounter: Payer: Self-pay | Admitting: Family Medicine

## 2015-12-18 VITALS — BP 132/76 | HR 90 | Temp 98.3°F | Ht 60.0 in | Wt 198.0 lb

## 2015-12-18 DIAGNOSIS — E010 Iodine-deficiency related diffuse (endemic) goiter: Secondary | ICD-10-CM

## 2015-12-18 DIAGNOSIS — E049 Nontoxic goiter, unspecified: Secondary | ICD-10-CM

## 2015-12-18 DIAGNOSIS — Z114 Encounter for screening for human immunodeficiency virus [HIV]: Secondary | ICD-10-CM

## 2015-12-18 DIAGNOSIS — Z0000 Encounter for general adult medical examination without abnormal findings: Secondary | ICD-10-CM

## 2015-12-18 DIAGNOSIS — N920 Excessive and frequent menstruation with regular cycle: Secondary | ICD-10-CM | POA: Diagnosis not present

## 2015-12-18 LAB — TSH: TSH: 0.48 mIU/L

## 2015-12-18 LAB — T3: T3 TOTAL: 105 ng/dL (ref 76–181)

## 2015-12-18 LAB — T4, FREE: FREE T4: 1.4 ng/dL (ref 0.8–1.8)

## 2015-12-18 NOTE — Patient Instructions (Addendum)
It was nice to meet you today!  For ear wax try debrox - can buy this over the counter  Checking thyroid tests today since your thyroid is enlarged Also routine HIV test -recommended that everyone gets this  Be well, Dr. Pollie Meyer   Health Maintenance, Female Adopting a healthy lifestyle and getting preventive care can go a long way to promote health and wellness. Talk with your health care provider about what schedule of regular examinations is right for you. This is a good chance for you to check in with your provider about disease prevention and staying healthy. In between checkups, there are plenty of things you can do on your own. Experts have done a lot of research about which lifestyle changes and preventive measures are most likely to keep you healthy. Ask your health care provider for more information. WEIGHT AND DIET  Eat a healthy diet  Be sure to include plenty of vegetables, fruits, low-fat dairy products, and lean protein.  Do not eat a lot of foods high in solid fats, added sugars, or salt.  Get regular exercise. This is one of the most important things you can do for your health.  Most adults should exercise for at least 150 minutes each week. The exercise should increase your heart rate and make you sweat (moderate-intensity exercise).  Most adults should also do strengthening exercises at least twice a week. This is in addition to the moderate-intensity exercise.  Maintain a healthy weight  Body mass index (BMI) is a measurement that can be used to identify possible weight problems. It estimates body fat based on height and weight. Your health care provider can help determine your BMI and help you achieve or maintain a healthy weight.  For females 75 years of age and older:   A BMI below 18.5 is considered underweight.  A BMI of 18.5 to 24.9 is normal.  A BMI of 25 to 29.9 is considered overweight.  A BMI of 30 and above is considered obese.  Watch levels of  cholesterol and blood lipids  You should start having your blood tested for lipids and cholesterol at 42 years of age, then have this test every 5 years.  You may need to have your cholesterol levels checked more often if:  Your lipid or cholesterol levels are high.  You are older than 42 years of age.  You are at high risk for heart disease.  CANCER SCREENING   Lung Cancer  Lung cancer screening is recommended for adults 37-28 years old who are at high risk for lung cancer because of a history of smoking.  A yearly low-dose CT scan of the lungs is recommended for people who:  Currently smoke.  Have quit within the past 15 years.  Have at least a 30-pack-year history of smoking. A pack year is smoking an average of one pack of cigarettes a day for 1 year.  Yearly screening should continue until it has been 15 years since you quit.  Yearly screening should stop if you develop a health problem that would prevent you from having lung cancer treatment.  Breast Cancer  Practice breast self-awareness. This means understanding how your breasts normally appear and feel.  It also means doing regular breast self-exams. Let your health care provider know about any changes, no matter how small.  If you are in your 20s or 30s, you should have a clinical breast exam (CBE) by a health care provider every 1-3 years as part of a  regular health exam.  If you are 40 or older, have a CBE every year. Also consider having a breast X-ray (mammogram) every year.  If you have a family history of breast cancer, talk to your health care provider about genetic screening.  If you are at high risk for breast cancer, talk to your health care provider about having an MRI and a mammogram every year.  Breast cancer gene (BRCA) assessment is recommended for women who have family members with BRCA-related cancers. BRCA-related cancers include:  Breast.  Ovarian.  Tubal.  Peritoneal  cancers.  Results of the assessment will determine the need for genetic counseling and BRCA1 and BRCA2 testing. Cervical Cancer Your health care provider may recommend that you be screened regularly for cancer of the pelvic organs (ovaries, uterus, and vagina). This screening involves a pelvic examination, including checking for microscopic changes to the surface of your cervix (Pap test). You may be encouraged to have this screening done every 3 years, beginning at age 69.  For women ages 73-65, health care providers may recommend pelvic exams and Pap testing every 3 years, or they may recommend the Pap and pelvic exam, combined with testing for human papilloma virus (HPV), every 5 years. Some types of HPV increase your risk of cervical cancer. Testing for HPV may also be done on women of any age with unclear Pap test results.  Other health care providers may not recommend any screening for nonpregnant women who are considered low risk for pelvic cancer and who do not have symptoms. Ask your health care provider if a screening pelvic exam is right for you.  If you have had past treatment for cervical cancer or a condition that could lead to cancer, you need Pap tests and screening for cancer for at least 20 years after your treatment. If Pap tests have been discontinued, your risk factors (such as having a new sexual partner) need to be reassessed to determine if screening should resume. Some women have medical problems that increase the chance of getting cervical cancer. In these cases, your health care provider may recommend more frequent screening and Pap tests. Colorectal Cancer  This type of cancer can be detected and often prevented.  Routine colorectal cancer screening usually begins at 42 years of age and continues through 42 years of age.  Your health care provider may recommend screening at an earlier age if you have risk factors for colon cancer.  Your health care provider may also  recommend using home test kits to check for hidden blood in the stool.  A small camera at the end of a tube can be used to examine your colon directly (sigmoidoscopy or colonoscopy). This is done to check for the earliest forms of colorectal cancer.  Routine screening usually begins at age 1.  Direct examination of the colon should be repeated every 5-10 years through 42 years of age. However, you may need to be screened more often if early forms of precancerous polyps or small growths are found. Skin Cancer  Check your skin from head to toe regularly.  Tell your health care provider about any new moles or changes in moles, especially if there is a change in a mole's shape or color.  Also tell your health care provider if you have a mole that is larger than the size of a pencil eraser.  Always use sunscreen. Apply sunscreen liberally and repeatedly throughout the day.  Protect yourself by wearing long sleeves, pants, a wide-brimmed hat,  and sunglasses whenever you are outside. HEART DISEASE, DIABETES, AND HIGH BLOOD PRESSURE   High blood pressure causes heart disease and increases the risk of stroke. High blood pressure is more likely to develop in:  People who have blood pressure in the high end of the normal range (130-139/85-89 mm Hg).  People who are overweight or obese.  People who are African American.  If you are 33-42 years of age, have your blood pressure checked every 3-5 years. If you are 36 years of age or older, have your blood pressure checked every year. You should have your blood pressure measured twice--once when you are at a hospital or clinic, and once when you are not at a hospital or clinic. Record the average of the two measurements. To check your blood pressure when you are not at a hospital or clinic, you can use:  An automated blood pressure machine at a pharmacy.  A home blood pressure monitor.  If you are between 46 years and 81 years old, ask your health  care provider if you should take aspirin to prevent strokes.  Have regular diabetes screenings. This involves taking a blood sample to check your fasting blood sugar level.  If you are at a normal weight and have a low risk for diabetes, have this test once every three years after 42 years of age.  If you are overweight and have a high risk for diabetes, consider being tested at a younger age or more often. PREVENTING INFECTION  Hepatitis B  If you have a higher risk for hepatitis B, you should be screened for this virus. You are considered at high risk for hepatitis B if:  You were born in a country where hepatitis B is common. Ask your health care provider which countries are considered high risk.  Your parents were born in a high-risk country, and you have not been immunized against hepatitis B (hepatitis B vaccine).  You have HIV or AIDS.  You use needles to inject street drugs.  You live with someone who has hepatitis B.  You have had sex with someone who has hepatitis B.  You get hemodialysis treatment.  You take certain medicines for conditions, including cancer, organ transplantation, and autoimmune conditions. Hepatitis C  Blood testing is recommended for:  Everyone born from [birth year] through 1967.  Anyone with known risk factors for hepatitis C. Sexually transmitted infections (STIs)  You should be screened for sexually transmitted infections (STIs) including gonorrhea and chlamydia if:  You are sexually active and are younger than 42 years of age.  You are older than 42 years of age and your health care provider tells you that you are at risk for this type of infection.  Your sexual activity has changed since you were last screened and you are at an increased risk for chlamydia or gonorrhea. Ask your health care provider if you are at risk.  If you do not have HIV, but are at risk, it may be recommended that you take a prescription medicine daily to prevent HIV  infection. This is called pre-exposure prophylaxis (PrEP). You are considered at risk if:  You are sexually active and do not regularly use condoms or know the HIV status of your partner(s).  You take drugs by injection.  You are sexually active with a partner who has HIV. Talk with your health care provider about whether you are at high risk of being infected with HIV. If you choose to begin PrEP, you should  first be tested for HIV. You should then be tested every 3 months for as long as you are taking PrEP.  PREGNANCY   If you are premenopausal and you may become pregnant, ask your health care provider about preconception counseling.  If you may become pregnant, take 400 to 800 micrograms (mcg) of folic acid every day.  If you want to prevent pregnancy, talk to your health care provider about birth control (contraception). OSTEOPOROSIS AND MENOPAUSE   Osteoporosis is a disease in which the bones lose minerals and strength with aging. This can result in serious bone fractures. Your risk for osteoporosis can be identified using a bone density scan.  If you are 69 years of age or older, or if you are at risk for osteoporosis and fractures, ask your health care provider if you should be screened.  Ask your health care provider whether you should take a calcium or vitamin D supplement to lower your risk for osteoporosis.  Menopause may have certain physical symptoms and risks.  Hormone replacement therapy may reduce some of these symptoms and risks. Talk to your health care provider about whether hormone replacement therapy is right for you.  HOME CARE INSTRUCTIONS   Schedule regular health, dental, and eye exams.  Stay current with your immunizations.   Do not use any tobacco products including cigarettes, chewing tobacco, or electronic cigarettes.  If you are pregnant, do not drink alcohol.  If you are breastfeeding, limit how much and how often you drink alcohol.  Limit  alcohol intake to no more than 1 drink per day for nonpregnant women. One drink equals 12 ounces of beer, 5 ounces of wine, or 1 ounces of hard liquor.  Do not use street drugs.  Do not share needles.  Ask your health care provider for help if you need support or information about quitting drugs.  Tell your health care provider if you often feel depressed.  Tell your health care provider if you have ever been abused or do not feel safe at home.   This information is not intended to replace advice given to you by your health care provider. Make sure you discuss any questions you have with your health care provider.   Document Released: 02/23/2011 Document Revised: 08/31/2014 Document Reviewed: 07/12/2013 Elsevier Interactive Patient Education [employer].

## 2015-12-18 NOTE — Progress Notes (Signed)
Date of Visit: 12/18/2015   HPI:  Patient presents today for a well woman exam.   Concerns today: none Periods: gets monthly. Heavier over last 2 years. Very painful. No intermenstrual bleeding. Some clots. Last 5 days. Contraception: condoms most of the time. Declines other contraception. Pelvic symptoms: mild normal vag discharge. No pelvic pain outside of periods. Sexual activity: one female partner. STD Screening: declines  Pap smear status: up to date, due October 2020 Exercise: does "boot camp" workout on Mondays and Wednesdays Diet: working on decreasing red meat, eating more salads to maintain weight Smoking: none Alcohol: occasional Drugs: none  Has had some cough and nasal congestion recently.  ROS: See HPI  [place]:  Cancers in family: none  PHYSICAL EXAM: BP 132/76 mmHg  Pulse 90  Temp(Src) 98.3 F (36.8 C) (Oral)  Ht 5' (1.524 m)  Wt 198 lb (89.812 kg)  BMI 38.67 kg/m2  LMP 11/25/2015 Gen: NAD, pleasant, cooperative HEENT: NCAT, PERRL, + thyromegaly palpable. No nodules. No  anterior cervical lymphadenopathy.  Nares patent. Oropharynx clear and moist. R tympanic membrane not visualized secondary to cerumen. Heart: RRR, no murmurs Lungs: CTAB, NWOB Abdomen: soft, nontender to palpation Neuro: grossly nonfocal, speech normal  ASSESSMENT/PLAN:  Health maintenance:  -STD screening: declines but ok with HIV test (one time testing as recommended by CDC) -pap smear: utd -lipid screening: normal 1.5 years ago, not needed today -immunizations: UTD -handout given on health maintenance topics  Menorrhagia with regular cycle Check pelvic ultrasound to eval pelvic anatomy, r/o fibroids, etc  Thyromegaly Noted on exam today. Check labs: TSH, T3, free T4, TPO antibodies  ear cerumen - recommend over the counter debrox  FOLLOW UP: Follow up as needed  [place] J. Ardelia Mems, [place]

## 2015-12-19 LAB — HIV ANTIBODY (ROUTINE TESTING W REFLEX): HIV: NONREACTIVE

## 2015-12-19 LAB — THYROID PEROXIDASE ANTIBODY: Thyroperoxidase Ab SerPl-aCnc: 2 IU/mL (ref <9)

## 2015-12-21 DIAGNOSIS — N920 Excessive and frequent menstruation with regular cycle: Secondary | ICD-10-CM | POA: Insufficient documentation

## 2015-12-21 DIAGNOSIS — E010 Iodine-deficiency related diffuse (endemic) goiter: Secondary | ICD-10-CM | POA: Insufficient documentation

## 2015-12-21 NOTE — Assessment & Plan Note (Signed)
Noted on exam today. Check labs: TSH, T3, free T4, TPO antibodies

## 2015-12-21 NOTE — Assessment & Plan Note (Addendum)
Check pelvic ultrasound to eval pelvic anatomy, r/o fibroids, etc

## 2015-12-23 ENCOUNTER — Ambulatory Visit (HOSPITAL_COMMUNITY): Payer: Managed Care, Other (non HMO)

## 2015-12-24 ENCOUNTER — Telehealth: Payer: Self-pay | Admitting: Family Medicine

## 2015-12-24 DIAGNOSIS — E010 Iodine-deficiency related diffuse (endemic) goiter: Secondary | ICD-10-CM

## 2015-12-24 NOTE — Telephone Encounter (Signed)
Called patient to discuss lab results. All thyroid labs were normal. Still unclear why patient has thyromegaly. Recommend ultrasound of thyroid to assess. She is agreeable. Will place order.  Of note patient has pelvic u/s scheduled for May 8 and would like to get both ultrasounds done on the same day if possible. Red team, can you schedule thyroid u/s and call her with appointment?  Thanks, Leeanne Rio, MD

## 2015-12-25 NOTE — Telephone Encounter (Signed)
LMOVM for pt to call us back. They didn't have an appt for her thyroid u/s on the same day as her other u/s, so they gave her the day after. [facility]. Deseree Kennon Holter, CMA

## 2015-12-30 ENCOUNTER — Ambulatory Visit (HOSPITAL_COMMUNITY)
Admission: RE | Admit: 2015-12-30 | Discharge: 2015-12-30 | Disposition: A | Discharge status: Home / Self Care | Payer: Managed Care, Other (non HMO) | Source: Ambulatory Visit | Attending: Family Medicine | Admitting: Family Medicine

## 2015-12-30 DIAGNOSIS — D252 Subserosal leiomyoma of uterus: Secondary | ICD-10-CM | POA: Insufficient documentation

## 2015-12-30 DIAGNOSIS — D250 Submucous leiomyoma of uterus: Secondary | ICD-10-CM | POA: Diagnosis not present

## 2015-12-30 DIAGNOSIS — N920 Excessive and frequent menstruation with regular cycle: Secondary | ICD-10-CM | POA: Diagnosis not present

## 2015-12-30 DIAGNOSIS — E010 Iodine-deficiency related diffuse (endemic) goiter: Secondary | ICD-10-CM

## 2015-12-31 ENCOUNTER — Ambulatory Visit (HOSPITAL_COMMUNITY): Payer: Managed Care, Other (non HMO)

## 2016-01-08 ENCOUNTER — Telehealth: Payer: Self-pay | Admitting: Family Medicine

## 2016-01-08 NOTE — Telephone Encounter (Signed)
Call patient back asap regarding results for [place] on thyroid and pelvic taken on 5/8

## 2016-01-10 NOTE — Telephone Encounter (Signed)
Late entry - called patient back 5/17 to discuss results  Thyroid ultrasound was normal. No further workup needed Pelvic ultrasound shows large fibroid submucosal to subserosal which may be the cause of her heavy bleeding Offered referral to GYN to discuss options for treatment She wants to think about it and ask around for recommendations about who to see. She'll call us back to let us know who she wants to be referred to  Patient appreciative  Leeanne Rio, MD

## 2016-01-14 ENCOUNTER — Telehealth: Payer: Self-pay | Admitting: Family Medicine

## 2016-02-04 ENCOUNTER — Other Ambulatory Visit: Payer: Self-pay | Admitting: *Deleted

## 2016-02-04 MED ORDER — TRIAMCINOLONE ACETONIDE 0.1 % EX CREA
1.0000 | TOPICAL_CREAM | Freq: Two times a day (BID) | CUTANEOUS | Status: DC | PRN
Start: 2016-02-04 — End: 2016-11-26

## 2016-02-04 NOTE — Telephone Encounter (Signed)
Pt would like a refill on her triamcinolone and if possible would like a larger tube or more refills.   This is not an active med on her list but was given in April 2017. Carols Clemence, Salome Spotted, CMA

## 2016-02-04 NOTE — Telephone Encounter (Signed)
Please ask her to come in for appointment to review why she needs the medicine. I will go ahead and send in larger tube with 1 refill but she should come in, since its been a while since we saw her to discuss eczema/rash.  Thanks Leeanne Rio, MD

## 2016-02-05 NOTE — Telephone Encounter (Signed)
Left message on voicemail informing that rx was sent in but she needs to schedule an appointment for follow up of this issue before any further refills. Asked that patient call back to schedule an appointment.

## 2016-03-17 ENCOUNTER — Other Ambulatory Visit: Payer: Self-pay | Admitting: Family Medicine

## 2016-03-17 DIAGNOSIS — Z1231 Encounter for screening mammogram for malignant neoplasm of breast: Secondary | ICD-10-CM

## 2016-03-26 ENCOUNTER — Ambulatory Visit
Admission: RE | Admit: 2016-03-26 | Discharge: 2016-03-26 | Disposition: A | Discharge status: Home / Self Care | Payer: Managed Care, Other (non HMO) | Source: Ambulatory Visit | Attending: Family Medicine | Admitting: Family Medicine

## 2016-03-26 DIAGNOSIS — Z1231 Encounter for screening mammogram for malignant neoplasm of breast: Secondary | ICD-10-CM

## 2016-08-12 ENCOUNTER — Encounter: Payer: Self-pay | Admitting: Internal Medicine

## 2016-08-12 ENCOUNTER — Ambulatory Visit (INDEPENDENT_AMBULATORY_CARE_PROVIDER_SITE_OTHER): Payer: Managed Care, Other (non HMO) | Admitting: Internal Medicine

## 2016-08-12 VITALS — BP 120/60 | HR 69 | Temp 98.5°F | Ht 60.0 in | Wt 198.6 lb

## 2016-08-12 DIAGNOSIS — R21 Rash and other nonspecific skin eruption: Secondary | ICD-10-CM

## 2016-08-12 MED ORDER — MOMETASONE FUROATE 0.1 % EX SOLN: Freq: Every day | CUTANEOUS | 0 refills | Status: DC

## 2016-08-12 NOTE — Progress Notes (Signed)
[facility] Progress Note  Subjective:  Erica Burnett is a 40 y.o. female who presents for SDA with concern for scalp rash. She has a history of eczema. She used a strong relaxant in her hair about 1.5 months ago and had burning of her scalp. She had been using various oils and conditioners to try to repair damage but had swelling, tenderness and some oozing over her L temple for about the last week. Swelling improved with ibuprofen, but she is concerned about infection. She denies fever, chills. No n/v/d or loss of appetite. She does report some continued wetness at site of L temple scab, especially if she draws comb over the area of dryness. She also complains of scalp itching. No headaches, no ear pain.   No Known Allergies  Social: Former smoker  Objective: Blood pressure 120/60, pulse 69, temperature 98.5 F (36.9 C), temperature source Oral, height 5' (1.524 m), weight 198 lb 9.6 oz (90.1 kg). Body mass index is 38.79 kg/m. Constitutional: Obese female in NAD HENT: NCAT. Short hair. TMs normal bilaterally Lymph: Pea-sized pre-auricular swelling on L with minimal tenderness. No other lymphadenopathy.  Cardiovascular: RRR, S1, S2, no m/r/g.  Pulmonary/Chest: Effort normal and breath sounds normal. No respiratory distress.  Skin: Skin is warm and dry. Yellowy crusted scab over L temple, about 2cm x 2 cm, extending a little upwards. Small area of dryness over R occiput. No drainage noted. When scab lifted, skin normal underneath.  Psych: Anxious affect.  Vitals reviewed     Assessment/Plan: Rash and nonspecific skin eruption - Area of concern on scalp consistent with dry skin. No open areas of drainage.  - Provided reassurance to patient that she is not having fever or redness at site.  - Advised avoiding combing area to prevent breaking open skin - Recommended applying mometasone solution to help with itching and to help lift away site of dryness if patient desired  intervention. Precepted with attending Dr. Gwendlyn Deutscher.  - Because of patient's concern for infection, suggested she could also try antibacterial ointment, though do not think scab consistent with infection such as impetigo. Provided a few packets.   Follow-up prn.  Olene Floss, MD [place], PGY-2

## 2016-08-12 NOTE — Patient Instructions (Signed)
Ms. Erica Burnett,  The area of scalp irritation appears to be on its way to healing.  I recommend applying steroid solution daily to help with itching and to loosen scab. I prescribed mometasone solution.  You may also want to continue applying bacitracin -- bactroban -- antibacterial ointment.  Best, Dr. Ola Spurr

## 2016-08-13 ENCOUNTER — Encounter (HOSPITAL_BASED_OUTPATIENT_CLINIC_OR_DEPARTMENT_OTHER): Payer: Self-pay

## 2016-08-13 ENCOUNTER — Emergency Department (HOSPITAL_BASED_OUTPATIENT_CLINIC_OR_DEPARTMENT_OTHER)
Admission: EM | Admit: 2016-08-13 | Discharge: 2016-08-13 | Disposition: A | Discharge status: Home / Self Care | Payer: Managed Care, Other (non HMO) | Attending: Emergency Medicine | Admitting: Emergency Medicine

## 2016-08-13 DIAGNOSIS — Z87891 Personal history of nicotine dependence: Secondary | ICD-10-CM | POA: Diagnosis not present

## 2016-08-13 DIAGNOSIS — Z79899 Other long term (current) drug therapy: Secondary | ICD-10-CM | POA: Insufficient documentation

## 2016-08-13 DIAGNOSIS — Z791 Long term (current) use of non-steroidal anti-inflammatories (NSAID): Secondary | ICD-10-CM | POA: Diagnosis not present

## 2016-08-13 DIAGNOSIS — T50905A Adverse effect of unspecified drugs, medicaments and biological substances, initial encounter: Secondary | ICD-10-CM | POA: Diagnosis not present

## 2016-08-13 DIAGNOSIS — L233 Allergic contact dermatitis due to drugs in contact with skin: Secondary | ICD-10-CM | POA: Insufficient documentation

## 2016-08-13 DIAGNOSIS — R21 Rash and other nonspecific skin eruption: Secondary | ICD-10-CM | POA: Insufficient documentation

## 2016-08-13 MED ORDER — METHYLPREDNISOLONE 4 MG PO TBPK: ORAL_TABLET | ORAL | 0 refills | Status: DC

## 2016-08-13 NOTE — ED Provider Notes (Signed)
[facility] Provider Note: Erica Spurling, MD, FACEP  CSN: [id number] MRN: [id number] ARRIVAL: 08/13/16 at [place]: [place] Erica Burnett is a 40 y.o. female with about a two-week history of scalp irritation. Irritation has been primarily on the left near the hairline. She was seen by her PCP yesterday who was advised to put bacitracin ointment on it. She has subsequently developed what she describes as severely burning, itching and weeping skin of her anterior scalp. Symptoms are worse near the hairline. She had not been using bacitracin before yesterday.   History reviewed. No pertinent past medical history.  History reviewed. No pertinent surgical history.  No family history on file.  Social History  Substance Use Topics  . Smoking status: Former Smoker    Types: Cigarettes  . Smokeless tobacco: Former Systems developer    Quit date: 06/16/2007  . Alcohol use No    Prior to Admission medications   Medication Sig Start Date End Date Taking? Authorizing Provider  ibuprofen (ADVIL,MOTRIN) 800 MG tablet TAKE ONE TABLET BY MOUTH EVERY 8 HOURS AS NEEDED FOR HEADACHE OR CRAMPING (ONLY WHEN ON HER CYCLE) 06/21/15   Kinnie Feil, MD  mometasone (ELOCON) 0.1 % lotion Apply topically daily. 08/12/16   Hillary Corinda Gubler, MD  triamcinolone cream (KENALOG) 0.1 % Apply 1 application topically 2 (two) times daily as needed. 02/04/16   Leeanne Rio, MD    Allergies Patient has no known allergies.   REVIEW OF SYSTEMS  Negative except as noted here or in the History of Present Illness.   PHYSICAL EXAMINATION  Initial Vital Signs Blood pressure 150/92, pulse 95, temperature 97.9 F (36.6 C), temperature source Oral, resp. rate 20, height 5' (1.524 m), weight 190 lb (86.2 kg), last menstrual period 08/09/2016, SpO2 100 %.  Examination General: Well-developed, well-nourished female in no acute distress;  appearance consistent with age of record HENT: normocephalic; atraumatic Eyes: pupils equal, round and reactive to light; extraocular muscles intact Neck: supple Heart: regular rate and rhythm Lungs: clear to auscultation bilaterally Abdomen: soft; nondistended; nontender; bowel sounds present Extremities: No deformity; full range of motion; pulses normal Neurologic: Awake, alert and oriented; motor function intact in all extremities and symmetric; no facial droop Skin: Warm and dry; weeping, crusted rash of the anterior scalp most prominently along the left anterior hairline:   Psychiatric: Normal mood and affect   RESULTS  Summary of this visit's results, reviewed by myself:   EKG Interpretation  Date/Time:    Ventricular Rate:    PR Interval:    QRS Duration:   QT Interval:    QTC Calculation:   R Axis:     Text Interpretation:        Laboratory Studies: No results found for this or any previous visit (from the past 24 hour(s)). Imaging Studies: No results found.  ED COURSE  Nursing notes and initial vitals signs, including pulse oximetry, reviewed.  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 She was advised to discontinue the bacitracin and we will place her on a Medrol Dosepak.  PROCEDURES    ED DIAGNOSES     ICD-9-CM ICD-10-CM   1. Allergic contact dermatitis due to drugs in contact with skin 692.3 L23.3        Shanon Rosser, MD 08/13/16 [phone]

## 2016-08-13 NOTE — ED Notes (Signed)
Pt verbalizes understanding of d/c instructions and denies any further needs at this time. 

## 2016-08-13 NOTE — Assessment & Plan Note (Signed)
-   Area of concern on scalp consistent with dry skin. No open areas of drainage.  - Provided reassurance to patient that she is not having fever or redness at site.  - Advised avoiding combing area to prevent breaking open skin - Recommended applying mometasone solution to help with itching and to help lift away site of dryness if patient desired intervention. Precepted with attending Dr. Gwendlyn Deutscher.  - Because of patient's concern for infection, suggested she could also try antibacterial ointment, though do not think scab consistent with infection such as impetigo. Provided a few packets.

## 2016-08-13 NOTE — ED Triage Notes (Signed)
Pt used bacitracin on her head earlier tonight for a scalp infection and since then has had increased drainage from scalp, increased itching and eye swelling.

## 2016-08-13 NOTE — ED Notes (Signed)
ED Provider at bedside. 

## 2016-11-24 ENCOUNTER — Other Ambulatory Visit: Payer: Self-pay | Admitting: Family Medicine

## 2017-03-02 ENCOUNTER — Encounter: Payer: Self-pay | Admitting: Obstetrics and Gynecology

## 2017-03-02 NOTE — H&P (Addendum)
Erica Burnett is an 41 y.o. female. Presenting for scheduled TAH BS cysto s/s enlarged fibroid uterus.   Pertinent Gynecological History: Menses: flow is excessive with use of 6 pads or tampons on heaviest days Bleeding: dysfunctional uterine bleeding Contraception: OCP (estrogen/progesterone) DES exposure: unknown Blood transfusions: none Sexually transmitted diseases: Trich, s/p tx Previous GYN Procedures: None  Last mammogram: WNL Date: 2017 Last pap: normal Date: 2015 OB History: G3, P2012, CS x2   Menstrual History: No LMP recorded.    Past Medical History:  Diagnosis Date  . Migraines     Past Surgical History:  Procedure Laterality Date  . CESAREAN SECTION    . REPEAT CESAREAN SECTION      No family history on file.  Social History:  reports that she has quit smoking. Her smoking use included Cigarettes. She quit smokeless tobacco use about 9 years ago. She reports that she does not drink alcohol or use drugs.  Allergies: No Known Allergies  No prescriptions prior to admission.    ROS neg except AUB  There were no vitals taken for this visit. Physical Exam Gen: well appearing, NAD CV: Reg rate Pulm: NWOB Abd: soft, nondistended, nontender, no masses GYN: uterus enlarged 12-14 week size, no adnexa ttp/CMT, prior CS x 2 incisions well healed Ext: No edema b/l  No results found for this or any previous visit (from the past 24 hour(s)).  No results found. [place]: 11x6x7cm uterus w/5cm posterior fibroid, 4cm anterior fibroid impinging on bladder (prior US w/just 5cm post fibroid) Hgb 11.7  Assessment/Plan: 41yo [id number] presenting for scheduled TAH BS and cysto secondary to AUB, pelvic pressure, and urinary incontinence. Declines EMB prior to surgery and she is aware of possible risk of endometrial carcinoma w/h/o AUB. H/o CS x2 and has enlarged fibroid uterus. Mild anemia, last >11. On Iron. H/o Migraine HA w/out aura. Risks discussed including infection,  bleeding, damage to surrounding structures, need for additional procedures, postoperative DVT/transfusion. All questions answered. Consent signed. Proceed with above surgery.   Caprini score of 6, plan for SCDs and postop anticoagulation on POD#1 if appropriate.   Lucillie Garfinkel MD  No updates to above H&P. Patient arrived NPO and was consented in PACU. Risks discussed including infection, bleeding, damage to surrounding structures, need for additional procedures, postop DVT, and need for blood transfusion. All questions answered. Consent signed. Proceed with above surgery.   Lucillie Garfinkel MD   Erica Burnett Marin Wisner 03/02/2017, 11:18 AM

## 2017-03-09 NOTE — Patient Instructions (Addendum)
Your procedure is scheduled on:  Thursday, July 26  Enter through the Main Entrance of [facility] at:  6 am  Pick up the phone at the desk and dial [phone].  Call this number if you have problems the morning of surgery: [phone].  Remember: Do NOT eat food or drink clear liquids (including water) after Midnight Wednesday  Take these medicines the morning of surgery with a SIP OF WATER:  None  Stop herbal medications and supplements at this time.  Do NOT wear jewelry (body piercing), metal hair clips/bobby pins, make-up, or nail polish. Do NOT wear lotions, powders, or perfumes.  You may wear deoderant. Do NOT shave for 48 hours prior to surgery. Do NOT bring valuables to the hospital. Contacts may not be worn into surgery.  Leave suitcase in car.  After surgery it may be brought to your room.  For patients admitted to the hospital, checkout time is 11:00 AM the day of discharge. Have a responsible adult drive you home and stay with you for 24 hours after your procedure.  Home with Boyfriend Erica Burnett cell [phone]

## 2017-03-11 ENCOUNTER — Encounter (HOSPITAL_COMMUNITY)
Admission: RE | Admit: 2017-03-11 | Discharge: 2017-03-11 | Disposition: A | Discharge status: Home / Self Care | Payer: 59 | Source: Ambulatory Visit | Attending: Obstetrics and Gynecology | Admitting: Obstetrics and Gynecology

## 2017-03-11 ENCOUNTER — Other Ambulatory Visit: Payer: Self-pay

## 2017-03-11 ENCOUNTER — Encounter (HOSPITAL_COMMUNITY): Payer: Self-pay

## 2017-03-11 DIAGNOSIS — R9439 Abnormal result of other cardiovascular function study: Secondary | ICD-10-CM | POA: Diagnosis not present

## 2017-03-11 DIAGNOSIS — Z01818 Encounter for other preprocedural examination: Secondary | ICD-10-CM | POA: Diagnosis not present

## 2017-03-11 LAB — BASIC METABOLIC PANEL
Anion gap: 7 (ref 5–15)
BUN: 9 mg/dL (ref 6–20)
CHLORIDE: 107 mmol/L (ref 101–111)
CO2: 22 mmol/L (ref 22–32)
CREATININE: 0.6 mg/dL (ref 0.44–1.00)
Calcium: 9 mg/dL (ref 8.9–10.3)
GFR calc Af Amer: >60 mL/min (ref >60)
GFR calc non Af Amer: >60 mL/min (ref >60)
GLUCOSE: 118 mg/dL — AB (ref 65–99)
POTASSIUM: 3.7 mmol/L (ref 3.5–5.1)
Sodium: 136 mmol/L (ref 135–145)

## 2017-03-11 LAB — TYPE AND SCREEN
ABO/RH(D): O POS
ANTIBODY SCREEN: NEGATIVE

## 2017-03-11 LAB — CBC
HEMATOCRIT: 37.1 % (ref 36.0–46.0)
Hemoglobin: 12 g/dL (ref 12.0–15.0)
MCH: 25.5 pg — ABNORMAL LOW (ref 26.0–34.0)
MCHC: 32.3 g/dL (ref 30.0–36.0)
MCV: 78.9 fL (ref 78.0–100.0)
PLATELETS: 368 10*3/uL (ref 150–400)
RBC: 4.7 MIL/uL (ref 3.87–5.11)
RDW: 15.7 % — AB (ref 11.5–15.5)
WBC: 8.5 10*3/uL (ref 4.0–10.5)

## 2017-03-11 LAB — ABO/RH: ABO/RH(D): O POS

## 2017-03-11 LAB — RAPID HIV SCREEN (HIV 1/2 AB+AG)
HIV 1/2 Antibodies: NONREACTIVE
HIV-1 P24 Antigen - HIV24: NONREACTIVE

## 2017-03-11 NOTE — Pre-Procedure Instructions (Signed)
Reviewed patient history, medications and EKG with Dr Jillyn Hidden.  No orders given.  [place] for surgery.

## 2017-03-12 LAB — RPR: RPR: NONREACTIVE

## 2017-03-18 ENCOUNTER — Encounter (HOSPITAL_COMMUNITY): Payer: Self-pay

## 2017-03-18 ENCOUNTER — Encounter (HOSPITAL_COMMUNITY)
Admission: RE | Disposition: A | Discharge status: Home / Self Care | Payer: Self-pay | Source: Ambulatory Visit | Attending: Obstetrics and Gynecology

## 2017-03-18 ENCOUNTER — Inpatient Hospital Stay (HOSPITAL_COMMUNITY)
Admission: RE | Admit: 2017-03-18 | Discharge: 2017-03-20 | DRG: 743 | Disposition: A | Discharge status: Home / Self Care | Payer: 59 | Source: Ambulatory Visit | Attending: Obstetrics and Gynecology | Admitting: Obstetrics and Gynecology

## 2017-03-18 ENCOUNTER — Inpatient Hospital Stay (HOSPITAL_COMMUNITY): Payer: 59 | Admitting: Anesthesiology

## 2017-03-18 DIAGNOSIS — Z87891 Personal history of nicotine dependence: Secondary | ICD-10-CM

## 2017-03-18 DIAGNOSIS — D259 Leiomyoma of uterus, unspecified: Principal | ICD-10-CM | POA: Diagnosis present

## 2017-03-18 DIAGNOSIS — N938 Other specified abnormal uterine and vaginal bleeding: Secondary | ICD-10-CM | POA: Diagnosis present

## 2017-03-18 DIAGNOSIS — N920 Excessive and frequent menstruation with regular cycle: Secondary | ICD-10-CM | POA: Diagnosis present

## 2017-03-18 DIAGNOSIS — D649 Anemia, unspecified: Secondary | ICD-10-CM | POA: Diagnosis present

## 2017-03-18 DIAGNOSIS — R32 Unspecified urinary incontinence: Secondary | ICD-10-CM | POA: Diagnosis present

## 2017-03-18 DIAGNOSIS — Z90710 Acquired absence of both cervix and uterus: Secondary | ICD-10-CM | POA: Diagnosis present

## 2017-03-18 HISTORY — PX: ABDOMINAL HYSTERECTOMY: SHX81

## 2017-03-18 HISTORY — DX: Migraine, unspecified, not intractable, without status migrainosus: G43.909

## 2017-03-18 HISTORY — PX: BILATERAL SALPINGECTOMY: SHX5743

## 2017-03-18 HISTORY — PX: CYSTOSCOPY: SHX5120

## 2017-03-18 LAB — PREGNANCY, URINE: PREG TEST UR: NEGATIVE

## 2017-03-18 SURGERY — HYSTERECTOMY, ABDOMINAL
Anesthesia: General

## 2017-03-18 MED ORDER — PROPOFOL 10 MG/ML IV BOLUS
INTRAVENOUS | Status: AC
  Filled 2017-03-18: qty 20

## 2017-03-18 MED ORDER — HYDROMORPHONE HCL 1 MG/ML IJ SOLN
0.2500 mg | INTRAMUSCULAR | Status: DC | PRN
  Administered 2017-03-18: 0.5 mg via INTRAVENOUS

## 2017-03-18 MED ORDER — HYDROMORPHONE HCL 1 MG/ML IJ SOLN
0.2000 mg | INTRAMUSCULAR | Status: DC | PRN
  Administered 2017-03-18: 0.6 mg via INTRAVENOUS
  Filled 2017-03-18: qty 1

## 2017-03-18 MED ORDER — GLYCOPYRROLATE 0.2 MG/ML IJ SOLN
INTRAMUSCULAR | Status: AC
  Filled 2017-03-18: qty 1

## 2017-03-18 MED ORDER — SENNOSIDES-DOCUSATE SODIUM 8.6-50 MG PO TABS: 1.0000 | ORAL_TABLET | Freq: Every evening | ORAL | Status: DC | PRN

## 2017-03-18 MED ORDER — ROCURONIUM BROMIDE 100 MG/10ML IV SOLN
INTRAVENOUS | Status: AC
  Filled 2017-03-18: qty 1

## 2017-03-18 MED ORDER — KETOROLAC TROMETHAMINE 30 MG/ML IJ SOLN: 30.0000 mg | Freq: Four times a day (QID) | INTRAMUSCULAR | Status: DC

## 2017-03-18 MED ORDER — ALUM & MAG HYDROXIDE-SIMETH 200-200-20 MG/5ML PO SUSP: 30.0000 mL | ORAL | Status: DC | PRN

## 2017-03-18 MED ORDER — PHENYLEPHRINE 40 MCG/ML (10ML) SYRINGE FOR IV PUSH (FOR BLOOD PRESSURE SUPPORT)
PREFILLED_SYRINGE | INTRAVENOUS | Status: AC
  Filled 2017-03-18: qty 10

## 2017-03-18 MED ORDER — FENTANYL CITRATE (PF) 250 MCG/5ML IJ SOLN
INTRAMUSCULAR | Status: AC
  Filled 2017-03-18: qty 5

## 2017-03-18 MED ORDER — SIMETHICONE 80 MG PO CHEW
80.0000 mg | CHEWABLE_TABLET | Freq: Four times a day (QID) | ORAL | Status: DC | PRN
Start: 2017-03-18 — End: 2017-03-20

## 2017-03-18 MED ORDER — LIDOCAINE 2% (20 MG/ML) 5 ML SYRINGE
INTRAMUSCULAR | Status: DC | PRN
  Administered 2017-03-18: 60 mg via INTRAVENOUS

## 2017-03-18 MED ORDER — MIDAZOLAM HCL 2 MG/2ML IJ SOLN
INTRAMUSCULAR | Status: AC
  Filled 2017-03-18: qty 2

## 2017-03-18 MED ORDER — KETOROLAC TROMETHAMINE 30 MG/ML IJ SOLN
INTRAMUSCULAR | Status: DC | PRN
  Administered 2017-03-18: 30 mg via INTRAVENOUS

## 2017-03-18 MED ORDER — PANTOPRAZOLE SODIUM 40 MG PO TBEC
40.0000 mg | DELAYED_RELEASE_TABLET | Freq: Every day | ORAL | Status: DC
  Administered 2017-03-19 – 2017-03-20 (×2): 40 mg via ORAL
  Filled 2017-03-18 (×2): qty 1

## 2017-03-18 MED ORDER — METOCLOPRAMIDE HCL 5 MG/ML IJ SOLN: 10.0000 mg | Freq: Once | INTRAMUSCULAR | Status: DC | PRN

## 2017-03-18 MED ORDER — DEXTROSE-NACL 5-0.45 % IV SOLN
125.0000 mL/h | INTRAVENOUS | Status: AC
  Administered 2017-03-18 (×2): 125 mL/h via INTRAVENOUS

## 2017-03-18 MED ORDER — PROPOFOL 10 MG/ML IV BOLUS
INTRAVENOUS | Status: DC | PRN
  Administered 2017-03-18: 200 mg via INTRAVENOUS

## 2017-03-18 MED ORDER — NEOSTIGMINE METHYLSULFATE 10 MG/10ML IV SOLN
INTRAVENOUS | Status: AC
  Filled 2017-03-18: qty 1

## 2017-03-18 MED ORDER — ONDANSETRON HCL 4 MG/2ML IJ SOLN
INTRAMUSCULAR | Status: DC | PRN
  Administered 2017-03-18: 4 mg via INTRAVENOUS

## 2017-03-18 MED ORDER — KETOROLAC TROMETHAMINE 30 MG/ML IJ SOLN
30.0000 mg | Freq: Four times a day (QID) | INTRAMUSCULAR | Status: DC
  Administered 2017-03-18: 30 mg via INTRAVENOUS
  Filled 2017-03-18 (×4): qty 1

## 2017-03-18 MED ORDER — SCOPOLAMINE 1 MG/3DAYS TD PT72
1.0000 | MEDICATED_PATCH | Freq: Once | TRANSDERMAL | Status: DC
  Administered 2017-03-18: 1.5 mg via TRANSDERMAL

## 2017-03-18 MED ORDER — ONDANSETRON HCL 4 MG PO TABS: 4.0000 mg | ORAL_TABLET | Freq: Four times a day (QID) | ORAL | Status: DC | PRN

## 2017-03-18 MED ORDER — NEOSTIGMINE METHYLSULFATE 5 MG/5ML IV SOSY
PREFILLED_SYRINGE | INTRAVENOUS | Status: DC | PRN
  Administered 2017-03-18: 2.5 mg via INTRAVENOUS

## 2017-03-18 MED ORDER — MEPERIDINE HCL 25 MG/ML IJ SOLN: 6.2500 mg | INTRAMUSCULAR | Status: DC | PRN

## 2017-03-18 MED ORDER — DOCUSATE SODIUM 100 MG PO CAPS
100.0000 mg | ORAL_CAPSULE | Freq: Two times a day (BID) | ORAL | Status: DC
  Administered 2017-03-18 – 2017-03-20 (×4): 100 mg via ORAL
  Filled 2017-03-18 (×4): qty 1

## 2017-03-18 MED ORDER — LIDOCAINE HCL 1 % IJ SOLN
INTRAMUSCULAR | Status: AC
  Filled 2017-03-18: qty 10

## 2017-03-18 MED ORDER — LACTATED RINGERS IV SOLN
INTRAVENOUS | Status: DC
  Administered 2017-03-18: 125 mL/h via INTRAVENOUS
  Administered 2017-03-18 (×3): via INTRAVENOUS

## 2017-03-18 MED ORDER — ROCURONIUM BROMIDE 100 MG/10ML IV SOLN
INTRAVENOUS | Status: DC | PRN
  Administered 2017-03-18: 40 mg via INTRAVENOUS
  Administered 2017-03-18: 20 mg via INTRAVENOUS
  Administered 2017-03-18: 10 mg via INTRAVENOUS

## 2017-03-18 MED ORDER — LIDOCAINE HCL (CARDIAC) 20 MG/ML IV SOLN
INTRAVENOUS | Status: AC
  Filled 2017-03-18: qty 5

## 2017-03-18 MED ORDER — DEXAMETHASONE SODIUM PHOSPHATE 10 MG/ML IJ SOLN
INTRAMUSCULAR | Status: AC
  Filled 2017-03-18: qty 1

## 2017-03-18 MED ORDER — ONDANSETRON HCL 4 MG/2ML IJ SOLN
4.0000 mg | Freq: Four times a day (QID) | INTRAMUSCULAR | Status: DC | PRN
  Administered 2017-03-18: 4 mg via INTRAVENOUS
  Filled 2017-03-18: qty 2

## 2017-03-18 MED ORDER — CEFAZOLIN SODIUM-DEXTROSE 2-4 GM/100ML-% IV SOLN
2.0000 g | INTRAVENOUS | Status: AC
  Administered 2017-03-18: 2 g via INTRAVENOUS

## 2017-03-18 MED ORDER — OXYCODONE HCL 5 MG PO TABS
10.0000 mg | ORAL_TABLET | ORAL | Status: DC | PRN
  Administered 2017-03-18: 10 mg via ORAL
  Administered 2017-03-18: 5 mg via ORAL
  Filled 2017-03-18 (×2): qty 2

## 2017-03-18 MED ORDER — DEXAMETHASONE SODIUM PHOSPHATE 4 MG/ML IJ SOLN
INTRAMUSCULAR | Status: DC | PRN
  Administered 2017-03-18: 10 mg via INTRAVENOUS

## 2017-03-18 MED ORDER — ONDANSETRON HCL 4 MG/2ML IJ SOLN
INTRAMUSCULAR | Status: AC
  Filled 2017-03-18: qty 2

## 2017-03-18 MED ORDER — MIDAZOLAM HCL 5 MG/5ML IJ SOLN
INTRAMUSCULAR | Status: DC | PRN
  Administered 2017-03-18: 2 mg via INTRAVENOUS

## 2017-03-18 MED ORDER — FENTANYL CITRATE (PF) 100 MCG/2ML IJ SOLN
INTRAMUSCULAR | Status: DC | PRN
  Administered 2017-03-18 (×3): 50 ug via INTRAVENOUS
  Administered 2017-03-18: 100 ug via INTRAVENOUS

## 2017-03-18 MED ORDER — PHENYLEPHRINE HCL 10 MG/ML IJ SOLN
INTRAMUSCULAR | Status: DC | PRN
  Administered 2017-03-18 (×2): 40 ug via INTRAVENOUS

## 2017-03-18 MED ORDER — GLYCOPYRROLATE 0.2 MG/ML IJ SOLN
INTRAMUSCULAR | Status: DC | PRN
  Administered 2017-03-18: .5 mg via INTRAVENOUS

## 2017-03-18 MED ORDER — SCOPOLAMINE 1 MG/3DAYS TD PT72
MEDICATED_PATCH | TRANSDERMAL | Status: AC
  Filled 2017-03-18: qty 1

## 2017-03-18 MED ORDER — ACETAMINOPHEN 325 MG PO TABS
650.0000 mg | ORAL_TABLET | Freq: Four times a day (QID) | ORAL | Status: DC
  Administered 2017-03-18 – 2017-03-20 (×8): 650 mg via ORAL
  Filled 2017-03-18 (×8): qty 2

## 2017-03-18 MED ORDER — SCOPOLAMINE 1 MG/3DAYS TD PT72
MEDICATED_PATCH | TRANSDERMAL | Status: AC
  Administered 2017-03-18: 1.5 mg via TRANSDERMAL
  Filled 2017-03-18: qty 1

## 2017-03-18 MED ORDER — HYDROMORPHONE HCL 1 MG/ML IJ SOLN
INTRAMUSCULAR | Status: AC
  Administered 2017-03-18: 0.5 mg via INTRAVENOUS
  Filled 2017-03-18: qty 1

## 2017-03-18 SURGICAL SUPPLY — 38 items
ADH SKN CLS APL DERMABOND .7 (GAUZE/BANDAGES/DRESSINGS) ×2
CANISTER SUCT 3000ML PPV (MISCELLANEOUS) ×4 IMPLANT
CLEANER TIP ELECTROSURG 2X2 (MISCELLANEOUS) IMPLANT
CLOTH BEACON ORANGE TIMEOUT ST (SAFETY) ×4 IMPLANT
DECANTER SPIKE VIAL GLASS SM (MISCELLANEOUS) IMPLANT
DERMABOND ADVANCED (GAUZE/BANDAGES/DRESSINGS) ×2
DERMABOND ADVANCED .7 DNX12 (GAUZE/BANDAGES/DRESSINGS) ×2 IMPLANT
DRAPE WARM FLUID 44X44 (DRAPE) ×2 IMPLANT
DRSG OPSITE POSTOP 4X10 (GAUZE/BANDAGES/DRESSINGS) ×4 IMPLANT
DURAPREP 26ML APPLICATOR (WOUND CARE) ×4 IMPLANT
GAUZE SPONGE 4X4 16PLY XRAY LF (GAUZE/BANDAGES/DRESSINGS) IMPLANT
GLOVE BIO SURGEON STRL SZ 6.5 (GLOVE) ×3 IMPLANT
GLOVE BIO SURGEONS STRL SZ 6.5 (GLOVE) ×1
GLOVE BIOGEL PI IND STRL 6.5 (GLOVE) ×2 IMPLANT
GLOVE BIOGEL PI IND STRL 7.0 (GLOVE) ×4 IMPLANT
GLOVE BIOGEL PI INDICATOR 6.5 (GLOVE) ×2
GLOVE BIOGEL PI INDICATOR 7.0 (GLOVE) ×4
GOWN STRL REUS W/TWL LRG LVL3 (GOWN DISPOSABLE) ×12 IMPLANT
LIGASURE IMPACT 36 18CM CVD LR (INSTRUMENTS) ×2 IMPLANT
NEEDLE HYPO 22GX1.5 SAFETY (NEEDLE) ×4 IMPLANT
NS IRRIG 1000ML POUR BTL (IV SOLUTION) ×4 IMPLANT
PACK ABDOMINAL GYN (CUSTOM PROCEDURE TRAY) ×4 IMPLANT
PAD OB MATERNITY 4.3X12.25 (PERSONAL CARE ITEMS) ×4 IMPLANT
PENCIL SMOKE EVAC W/HOLSTER (ELECTROSURGICAL) ×4 IMPLANT
PROTECTOR NERVE ULNAR (MISCELLANEOUS) ×8 IMPLANT
RTRCTR C-SECT PINK 25CM LRG (MISCELLANEOUS) ×2 IMPLANT
SPONGE LAP 18X18 X RAY DECT (DISPOSABLE) IMPLANT
SUT PLAIN 2 0 (SUTURE) ×4
SUT PLAIN ABS 2-0 CT1 27XMFL (SUTURE) ×2 IMPLANT
SUT VIC AB 0 CT1 18XCR BRD8 (SUTURE) ×4 IMPLANT
SUT VIC AB 0 CT1 36 (SUTURE) ×12 IMPLANT
SUT VIC AB 0 CT1 8-18 (SUTURE) ×8
SUT VIC AB 0 CTB1 36 (SUTURE) ×4 IMPLANT
SUT VIC AB 4-0 PS2 27 (SUTURE) ×4 IMPLANT
SUT VICRYL 0 TIES 12 18 (SUTURE) ×4 IMPLANT
SYRINGE 10CC LL (SYRINGE) ×4 IMPLANT
TOWEL OR 17X24 6PK STRL BLUE (TOWEL DISPOSABLE) ×8 IMPLANT
TRAY FOLEY CATH SILVER 14FR (SET/KITS/TRAYS/PACK) ×4 IMPLANT

## 2017-03-18 NOTE — Progress Notes (Signed)
Day of Surgery Procedure(s) (LRB): HYSTERECTOMY ABDOMINAL (N/A) CYSTOSCOPY (N/A) BILATERAL SALPINGECTOMY  Subjective: Patient reports incisional pain 3/10, mostly well-controlled, due for pain meds now. Has tolerated sips of fluids, feels hungry for lunch.   Objective: I have reviewed patient's vital signs, intake and output and medications.  General: alert, cooperative and appears stated age Resp: clear to auscultation bilaterally Cardio: regular rate and rhythm, S1, S2 normal, no murmur, click, rub or gallop GI: soft, non-tender; bowel sounds normal; no masses,  no organomegaly and incision: clean, dry and intact Extremities: extremities normal, atraumatic, no cyanosis or edema Vaginal Bleeding: none Back: No CVA tenderness  Assessment: s/p Procedure(s) with comments: HYSTERECTOMY ABDOMINAL (N/A) - Rex Bickley coming from [place] for lighted retractor. One time use approved with no cost to patient. CYSTOSCOPY (N/A) BILATERAL SALPINGECTOMY: stable and progressing well  Plan: Advance diet Encourage ambulation Continue foley due to strict I&O and Plan to d/c tonight  Plan for lovenox in the AM as long as hgb/PLTs ok as Caprini score is 6. No s/s of active bleeding/anemia.    LOS: 0 days    Tyson Dense 03/18/2017, 1:09 PM

## 2017-03-18 NOTE — Anesthesia Preprocedure Evaluation (Addendum)
Anesthesia Evaluation  Patient identified by MRN, date of birth, ID band Patient awake    Reviewed: Allergy & Precautions, NPO status , Patient's Chart, lab work & pertinent test results  Airway Mallampati: III  TM Distance: >3 FB Neck ROM: Full    Dental  (+) Chipped, Caps,    Pulmonary former smoker,    Pulmonary exam normal breath sounds clear to auscultation       Cardiovascular negative cardio ROS Normal cardiovascular exam Rhythm:Regular Rate:Normal     Neuro/Psych  Headaches, negative psych ROS   GI/Hepatic negative GI ROS, Neg liver ROS,   Endo/Other  negative endocrine ROSObesity  Renal/GU negative Renal ROS  negative genitourinary   Musculoskeletal Eczema   Abdominal (+) + obese,   Peds  Hematology negative hematology ROS (+)   Anesthesia Other Findings   Reproductive/Obstetrics Uterine leiomyomas DUB Menorrhagia                            Anesthesia Physical Anesthesia Plan  ASA: II  Anesthesia Plan: General   Post-op Pain Management:    Induction: Intravenous  PONV Risk Score and Plan: 4 or greater and Ondansetron, Dexamethasone, Propofol, Midazolam, Scopolamine patch - Pre-op and Metaclopromide  Airway Management Planned: Oral ETT  Additional Equipment:   Intra-op Plan:   Post-operative Plan: Extubation in OR  Informed Consent: I have reviewed the patients History and Physical, chart, labs and discussed the procedure including the risks, benefits and alternatives for the proposed anesthesia with the patient or authorized representative who has indicated his/her understanding and acceptance.   Dental advisory given  Plan Discussed with: CRNA, Anesthesiologist and Surgeon  Anesthesia Plan Comments:         Anesthesia Quick Evaluation

## 2017-03-18 NOTE — Brief Op Note (Signed)
03/18/2017  10:30 AM  PATIENT:  Erica Burnett  41 y.o. female  PRE-OPERATIVE DIAGNOSIS:  fibroids, AUB  POST-OPERATIVE DIAGNOSIS:  fibroids, AUB  PROCEDURE:  Procedure(s) with comments: HYSTERECTOMY ABDOMINAL (N/A) - Rex Bickley coming from [place] for lighted retractor. One time use approved with no cost to patient. CYSTOSCOPY (N/A) BILATERAL SALPINGECTOMY  SURGEON:  Surgeon(s) and Role:    * Leger, Colin Benton, MD - Primary  PHYSICIAN ASSISTANT:   ASSISTANTS: Dr. Arvella Nigh   ANESTHESIA:   general  EBL:  Total I/O In: 6811 [I.V.:2700] Out: 572 [Urine:375; Blood:400]  BLOOD ADMINISTERED:none  DRAINS: Urinary Catheter (Foley)   LOCAL MEDICATIONS USED:  NONE  SPECIMEN:  Source of Specimen:  uterus with bilateral fallopian tubes and cervix  DISPOSITION OF SPECIMEN:  PATHOLOGY  COUNTS:  YES  TOURNIQUET:  * No tourniquets in log *  DICTATION: .Note written in EPIC  PLAN OF CARE: Admit to inpatient   PATIENT DISPOSITION:  PACU - hemodynamically stable.   Delay start of Pharmacological VTE agent (>24hrs) due to surgical blood loss or risk of bleeding: No

## 2017-03-18 NOTE — Anesthesia Procedure Notes (Signed)
Procedure Name: Intubation Date/Time: 03/18/2017 7:25 AM Performed by: Vernice Jefferson Pre-anesthesia Checklist: Patient identified, Emergency Drugs available, Suction available, Patient being monitored and Timeout performed Patient Re-evaluated:Patient Re-evaluated prior to induction Oxygen Delivery Method: Circle system utilized Preoxygenation: Pre-oxygenation with 100% oxygen Induction Type: IV induction Ventilation: Mask ventilation without difficulty Laryngoscope Size: Mac and 3 Grade View: Grade II Tube type: Oral Tube size: 7.0 mm Number of attempts: 1 Airway Equipment and Method: Stylet Placement Confirmation: ETT inserted through vocal cords under direct vision,  positive ETCO2 and breath sounds checked- equal and bilateral Secured at: 21 cm Tube secured with: Tape Dental Injury: Teeth and Oropharynx as per pre-operative assessment

## 2017-03-18 NOTE — Transfer of Care (Signed)
Immediate Anesthesia Transfer of Care Note  Patient: Erica Burnett  Procedure(s) Performed: Procedure(s) with comments: HYSTERECTOMY ABDOMINAL (N/A) - Rex Bickley coming from [place] for lighted retractor. One time use approved with no cost to patient. CYSTOSCOPY (N/A) BILATERAL SALPINGECTOMY  Patient Location: PACU  Anesthesia Type:General  Level of Consciousness: awake and oriented  Airway & Oxygen Therapy: Patient Spontanous Breathing and Patient connected to nasal cannula oxygen  Post-op Assessment: Report given to RN and Post -op Vital signs reviewed and stable  Post vital signs: Reviewed and stable  Last Vitals:  Vitals:   03/18/17 0609  BP: 126/87  Pulse: 80  Resp: 16  Temp: 36.7 C    Last Pain:  Vitals:   03/18/17 0609  TempSrc: Oral      Patients Stated Pain Goal: 3 (34/91/79 1505)  Complications: No apparent anesthesia complications

## 2017-03-18 NOTE — Anesthesia Postprocedure Evaluation (Signed)
Anesthesia Post Note  Patient: Brain Hilts  Procedure(s) Performed: Procedure(s) (LRB): HYSTERECTOMY ABDOMINAL (N/A) CYSTOSCOPY (N/A) BILATERAL SALPINGECTOMY     Patient location during evaluation: PACU Anesthesia Type: General Level of consciousness: awake and alert Pain management: pain level controlled Vital Signs Assessment: post-procedure vital signs reviewed and stable Respiratory status: spontaneous breathing, nonlabored ventilation, respiratory function stable and patient connected to nasal cannula oxygen Cardiovascular status: blood pressure returned to baseline and stable Postop Assessment: no signs of nausea or vomiting Anesthetic complications: no    Last Vitals:  Vitals:   03/18/17 1145 03/18/17 1240  BP: 133/76 (!) 141/89  Pulse: 76 70  Resp: 16 16  Temp: 36.7 C 36.9 C    Last Pain:  Vitals:   03/18/17 1241  TempSrc:   PainSc: 5    Pain Goal: Patients Stated Pain Goal: 3 (03/18/17 1241)               Ryan P Ellender

## 2017-03-18 NOTE — Op Note (Addendum)
PREOPERATIVE DIAGNOSES: 1. Enlarged fibroid uterus. 2. Abnormal uterine bleeding.  POSTOPERATIVE DIAGNOSES: 1. Enlarged fibroid uterus. 2. Abnormal uterine bleeding.  PROCEDURE PERFORMED: Total abdominal hysterectomy with bilateral salpingectomy and cystoscopy  SURGEON: Dr. Lucillie Garfinkel ASSISTANT: Dr. Arvella Nigh  ANESTHESIA: General   ESTIMATED BLOOD LOSS: 400cc.  URINE OUTPUT: 400 cc of clear urine at the end of the procedure.  FLUIDS: 2700 cc of crystalloids.  COMPLICATIONS: None  TUBES: None.  DRAINS: Foley to gravity.  PATHOLOGY: Uterus, cervix, bilateral fallopian tubes, and multiple fibroids were sent to pathology for review.  FINDINGS: On exam, under anesthesia, normal appearing vulva and vagina, enlarged uterus approximately 14 weeks' in size with irregular contours suggestive of fibroids.   Operative findings demonstrated a fibroid uterus with multiple fibroids. Some moderate scar tissue. Bladder densely adherent.  Normal fallopian tubes and ovaries bilaterally. The bowel and omentum were normal appearing.  Procedure: The patient was prepped and draped in the usual sterile manner for an abdominal procedure. A pfannenstiel incision was made and carried down to the underlying fascia. Fascia was incised in the midline and extended bilaterally with mayo scissors. Underlying rectus muscles were separated from the fascia superiorly and inferiorly in the usual fashion. Peritoneum was entered sharply with hemostat and metz with good visualization of the bladder and bowel. Once inside the abdominal cavity, a self-retaining retractor was placed. The uterus was then identified and grasped with upward traction. The round ligaments on either side were identified and individually dissected and ligated with #0 Vicryl suture and divided. This allowed [place] to then create a bladder flap by both blunt and sharp dissection.  Salpingectomy performed: The fallopian tube and ovarian ligament were  isolated through the broad ligament from the uterine body and ligated and then fixated with 0 vicryl and tied with a free tie as well.   We then skeletonized the uterine vessels on either side and carefully dissected the bladder flap anteriorly. Posteriorly, the peritoneum was dissected down toward the uterosacral ligaments. Heaney clamps were then placed at each isthmic portion of the cervical body junction where the uterine arteries adjoined the uterus. These were clamped, ligated and divided using #0 Vicryl suture. The remainder of the uterus was then removed by the clamp-cut-ligation technique using #0 Vicryl on all major pedicles. With removal of the uterus, the vaginal cuff was closed with two haney stitches and then with serial figure of either stiches using #0 vicryl. Hemostasis was then inspected and secured throughout the entire area including the vaginal cuff, all pedicles, and the bladder. The lap sponges were then removed and the self-retaining retractor was removed. The patient tolerated the operation nicely. There were no complications associated with this surgical procedure to this point. The sponge count was correct times 2 at this time. The Foley catheter was inspected and clear urine was noted. Having removed all instruments and packs, we then began closure of the abdomen. The peritoneum was closed with #2-0 Vicryl in a running continuous manner. The fascia was closed with #0 Vicryl in a running continuous manner and the subcutaneous tissue was also closed with #2-0 Vicryl in a running continuous manner. Hemostasis was secured throughout the entire layers. The incision was then closed. Cystoscopy was performed and bilateral brisk ureteral jets noted. Bladder survey was WNL.   The patient tolerated the operation nicely and was then taken to the Recovery Room in good condition.   Lucillie Garfinkel MD

## 2017-03-19 ENCOUNTER — Encounter (HOSPITAL_COMMUNITY): Payer: Self-pay | Admitting: Obstetrics and Gynecology

## 2017-03-19 LAB — CBC
HEMATOCRIT: 29.4 % — AB (ref 36.0–46.0)
Hemoglobin: 9.6 g/dL — ABNORMAL LOW (ref 12.0–15.0)
MCH: 25.7 pg — AB (ref 26.0–34.0)
MCHC: 32.7 g/dL (ref 30.0–36.0)
MCV: 78.6 fL (ref 78.0–100.0)
PLATELETS: 305 10*3/uL (ref 150–400)
RBC: 3.74 MIL/uL — ABNORMAL LOW (ref 3.87–5.11)
RDW: 15.6 % — AB (ref 11.5–15.5)
WBC: 15.9 10*3/uL — ABNORMAL HIGH (ref 4.0–10.5)

## 2017-03-19 LAB — BASIC METABOLIC PANEL
ANION GAP: 8 (ref 5–15)
BUN: 7 mg/dL (ref 6–20)
CALCIUM: 9.2 mg/dL (ref 8.9–10.3)
CO2: 24 mmol/L (ref 22–32)
CREATININE: 0.59 mg/dL (ref 0.44–1.00)
Chloride: 106 mmol/L (ref 101–111)
GFR calc Af Amer: >60 mL/min (ref >60)
GFR calc non Af Amer: >60 mL/min (ref >60)
GLUCOSE: 119 mg/dL — AB (ref 65–99)
Potassium: 3.9 mmol/L (ref 3.5–5.1)
Sodium: 138 mmol/L (ref 135–145)

## 2017-03-19 MED ORDER — IBUPROFEN 600 MG PO TABS
600.0000 mg | ORAL_TABLET | Freq: Three times a day (TID) | ORAL | Status: DC
  Administered 2017-03-19 – 2017-03-20 (×3): 600 mg via ORAL
  Filled 2017-03-19 (×3): qty 1

## 2017-03-19 MED ORDER — KETOROLAC TROMETHAMINE 30 MG/ML IJ SOLN
30.0000 mg | Freq: Four times a day (QID) | INTRAMUSCULAR | Status: AC
  Administered 2017-03-19: 30 mg via INTRAVENOUS
  Filled 2017-03-19: qty 1

## 2017-03-19 MED ORDER — ENOXAPARIN SODIUM 40 MG/0.4ML ~~LOC~~ SOLN
40.0000 mg | SUBCUTANEOUS | Status: DC
  Administered 2017-03-19 – 2017-03-20 (×2): 40 mg via SUBCUTANEOUS
  Filled 2017-03-19 (×2): qty 0.4

## 2017-03-19 NOTE — Progress Notes (Signed)
Day of Surgery Procedure(s) (LRB): HYSTERECTOMY ABDOMINAL (N/A) CYSTOSCOPY (N/A) BILATERAL SALPINGECTOMY  Subjective: Patient feeling well. Minimal pain c/w PO pain meds. VF and without issues. Ambulating.   Objective: I have reviewed patient's vital signs, intake and output and medications.  General: alert, cooperative and appears stated age Resp: clear to auscultation bilaterally Cardio: regular rate and rhythm, S1, S2 normal, no murmur, click, rub or gallop GI: soft, non-tender; bowel sounds normal; no masses,  no organomegaly and incision: clean, dry and intact Extremities: extremities normal, atraumatic, no cyanosis or edema Vaginal Bleeding: none Back: No CVA tenderness  Assessment: s/p Procedure(s) with comments: HYSTERECTOMY ABDOMINAL (N/A) CYSTOSCOPY (N/A) BILATERAL SALPINGECTOMY: stable and progressing well  Plan: General diet Encourage continued ambulation VF, no issues Await flatus Lovenox qd (Caprini score 6). No s/s of active bleeding/anemia.    LOS: 1 days    Erica Burnett 03/18/2017, 1:09 PM

## 2017-03-20 MED ORDER — IBUPROFEN 600 MG PO TABS: 600.0000 mg | ORAL_TABLET | Freq: Four times a day (QID) | ORAL | 0 refills | Status: DC | PRN

## 2017-03-20 MED ORDER — FERROUS SULFATE 325 (65 FE) MG PO TBEC: 325.0000 mg | DELAYED_RELEASE_TABLET | Freq: Two times a day (BID) | ORAL | 2 refills | Status: DC

## 2017-03-20 MED ORDER — DOCUSATE SODIUM 100 MG PO CAPS: 100.0000 mg | ORAL_CAPSULE | Freq: Two times a day (BID) | ORAL | 2 refills | Status: DC

## 2017-03-20 NOTE — Discharge Summary (Signed)
Physician Discharge Summary  Patient ID: Erica Burnett MRN: [id number] DOB/AGE: [birth year] 41 y.o.  Admit date: 03/18/2017 Discharge date: 03/20/2017  Admission Diagnoses:  Discharge Diagnoses:  Active Problems:   S/P hysterectomy   Discharged Condition: good  Hospital Course: Pt is a 41yo admitted for scheduled above surgery. She Had an uncomplicated TAH BS cysto with an EBL of 400cc. She had an uncomplicated postoperative course and on POD#2 was meeting all goals such as ambulating, passing flatus, voiding freely, and tolerating a general diet and she was deemed stable for discharge home.    Consults: None  Significant Diagnostic Studies: labs:  CBC    Component Value Date/Time   WBC 15.9 (H) 03/19/2017 0549   RBC 3.74 (L) 03/19/2017 0549   HGB 9.6 (L) 03/19/2017 0549   HCT 29.4 (L) 03/19/2017 0549   PLT 305 03/19/2017 0549   MCV 78.6 03/19/2017 0549   MCH 25.7 (L) 03/19/2017 0549   MCHC 32.7 03/19/2017 0549   RDW 15.6 (H) 03/19/2017 0549   LYMPHSABS 2.0 06/19/2014 0951   MONOABS 0.4 06/19/2014 0951   EOSABS 0.2 06/19/2014 0951   BASOSABS 0.0 06/19/2014 0951    Treatments: surgery: TAH BS cysto  Discharge Exam: Blood pressure 114/68, pulse 72, temperature 98.6 F (37 C), temperature source Oral, resp. rate 18, height 5\' 1"  (1.549 m), weight 93 kg (205 lb), last menstrual period 03/02/2017, SpO2 100 %. General appearance: alert, cooperative and appears stated age Back: symmetric, no curvature. ROM normal. No CVA tenderness. Resp: clear to auscultation bilaterally Cardio: regular rate and rhythm, S1, S2 normal, no murmur, click, rub or gallop GI: soft, non-tender; bowel sounds normal; no masses,  no organomegaly Pelvic: no blood on pad Incision/Wound: c/d/i  Disposition: 01-Home or Self Care  Discharge Instructions    Call MD for:    Complete by:  As directed    Heavy vaginal bleeding or abnormal vaginal discharge   Call MD for:  difficulty breathing, headache  or visual disturbances    Complete by:  As directed    Call MD for:  extreme fatigue    Complete by:  As directed    Call MD for:  hives    Complete by:  As directed    Call MD for:  persistant dizziness or light-headedness    Complete by:  As directed    Call MD for:  persistant nausea and vomiting    Complete by:  As directed    Call MD for:  redness, tenderness, or signs of infection (pain, swelling, redness, odor or green/yellow discharge around incision site)    Complete by:  As directed    Call MD for:  severe uncontrolled pain    Complete by:  As directed    Call MD for:  temperature >100.4    Complete by:  As directed    Diet general    Complete by:  As directed    Discharge instructions    Complete by:  As directed    Call for fever >100.4 Don't life anything >15 pounds Nothing in the vagina Call for heavy vaginal bleeding   Driving Restrictions    Complete by:  As directed    Do not drive until you are off narcotic pain medications and you feel like you can react in an emergency.   Increase activity slowly    Complete by:  As directed    Leave dressing on - Keep it clean, dry, and intact until clinic visit  Complete by:  As directed    Lifting restrictions    Complete by:  As directed    Don't lift anything more than 15-20 pounds   Sexual Activity Restrictions    Complete by:  As directed    Nothing in the vagina x 2 to 6 weeks. We will discuss at clinic visit.     Allergies as of 03/20/2017      Reactions   Bacitracin Hives, Swelling   Facial hives/scalp oozing/skin hot to touch      Medication List    STOP taking these medications   SPRINTEC 28 0.25-35 MG-MCG tablet Generic drug:  norgestimate-ethinyl estradiol     TAKE these medications   docusate sodium 100 MG capsule Commonly known as:  COLACE Take 1 capsule (100 mg total) by mouth 2 (two) times daily.   ferrous sulfate 325 (65 FE) MG EC tablet Take 1 tablet (325 mg total) by mouth 2 (two)  times daily.   fluocinonide 0.05 % external solution Commonly known as:  LIDEX Apply 1 application topically 2 (two) times daily as needed (for skin irritation/eczema.).   ibuprofen 600 MG tablet Commonly known as:  ADVIL,MOTRIN Take 1 tablet (600 mg total) by mouth every 6 (six) hours as needed. What changed:  medication strength  See the new instructions.   triamcinolone cream 0.1 % Commonly known as:  KENALOG Apply topically 2 (two) times daily as needed. What changed:  how much to take  reasons to take this        Signed: Tyson Dense 03/20/2017, 10:09 AM

## 2018-07-20 ENCOUNTER — Other Ambulatory Visit: Payer: Self-pay | Admitting: Obstetrics and Gynecology

## 2018-07-20 DIAGNOSIS — Z1231 Encounter for screening mammogram for malignant neoplasm of breast: Secondary | ICD-10-CM

## 2018-07-25 IMAGING — MG DIGITAL SCREENING BILATERAL MAMMOGRAM WITH CAD
4 series · 4 of 4 positions shown · non-contrast
Comparison: None.

CLINICAL DATA: Screening.

EXAM:
DIGITAL SCREENING BILATERAL MAMMOGRAM WITH CAD

[R CC]
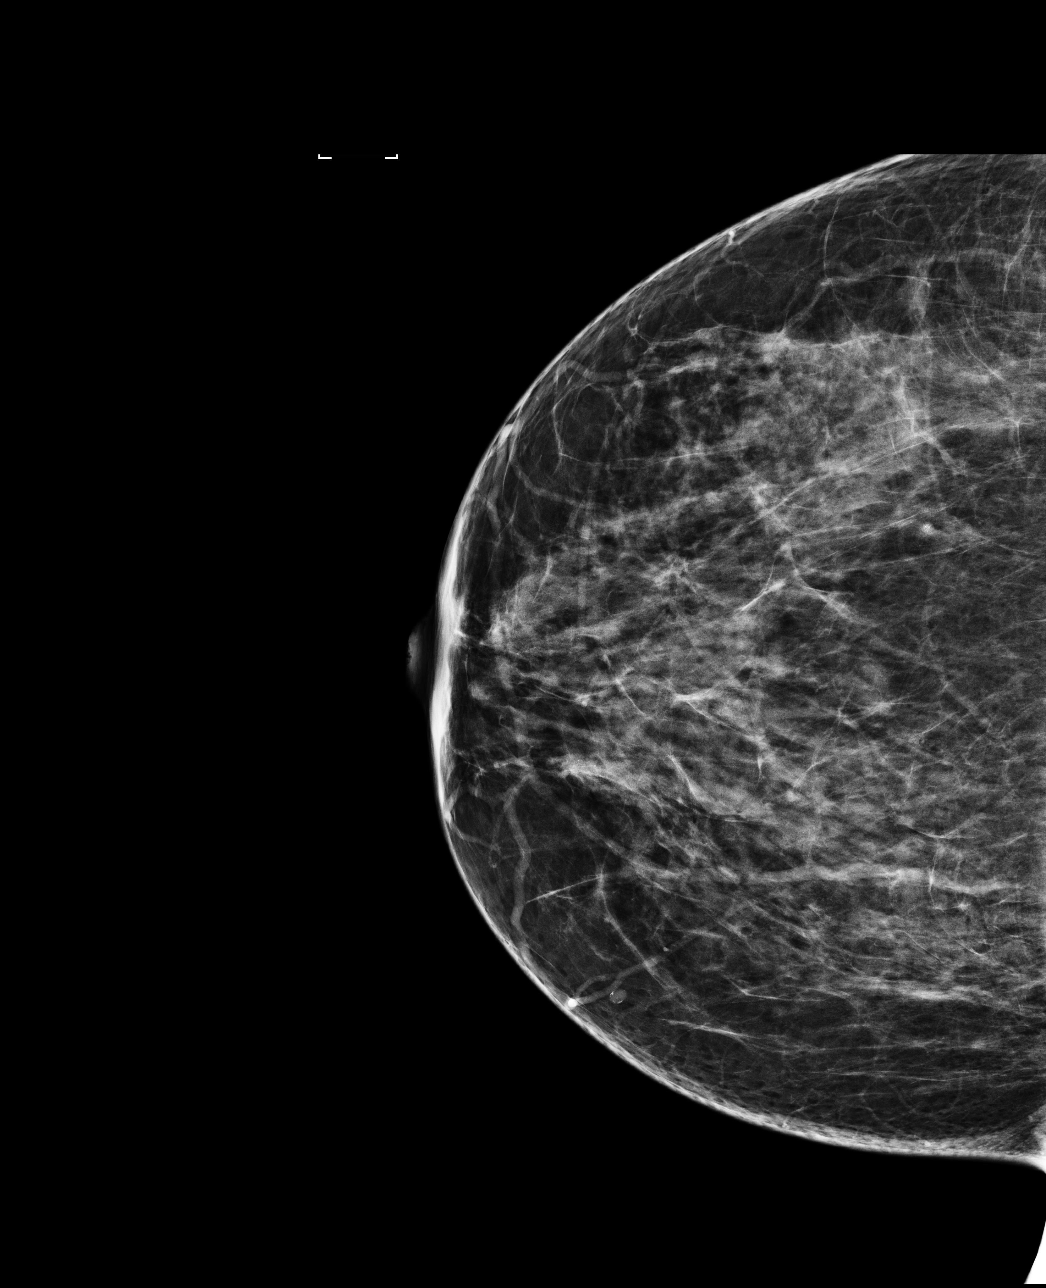

[R MLO]
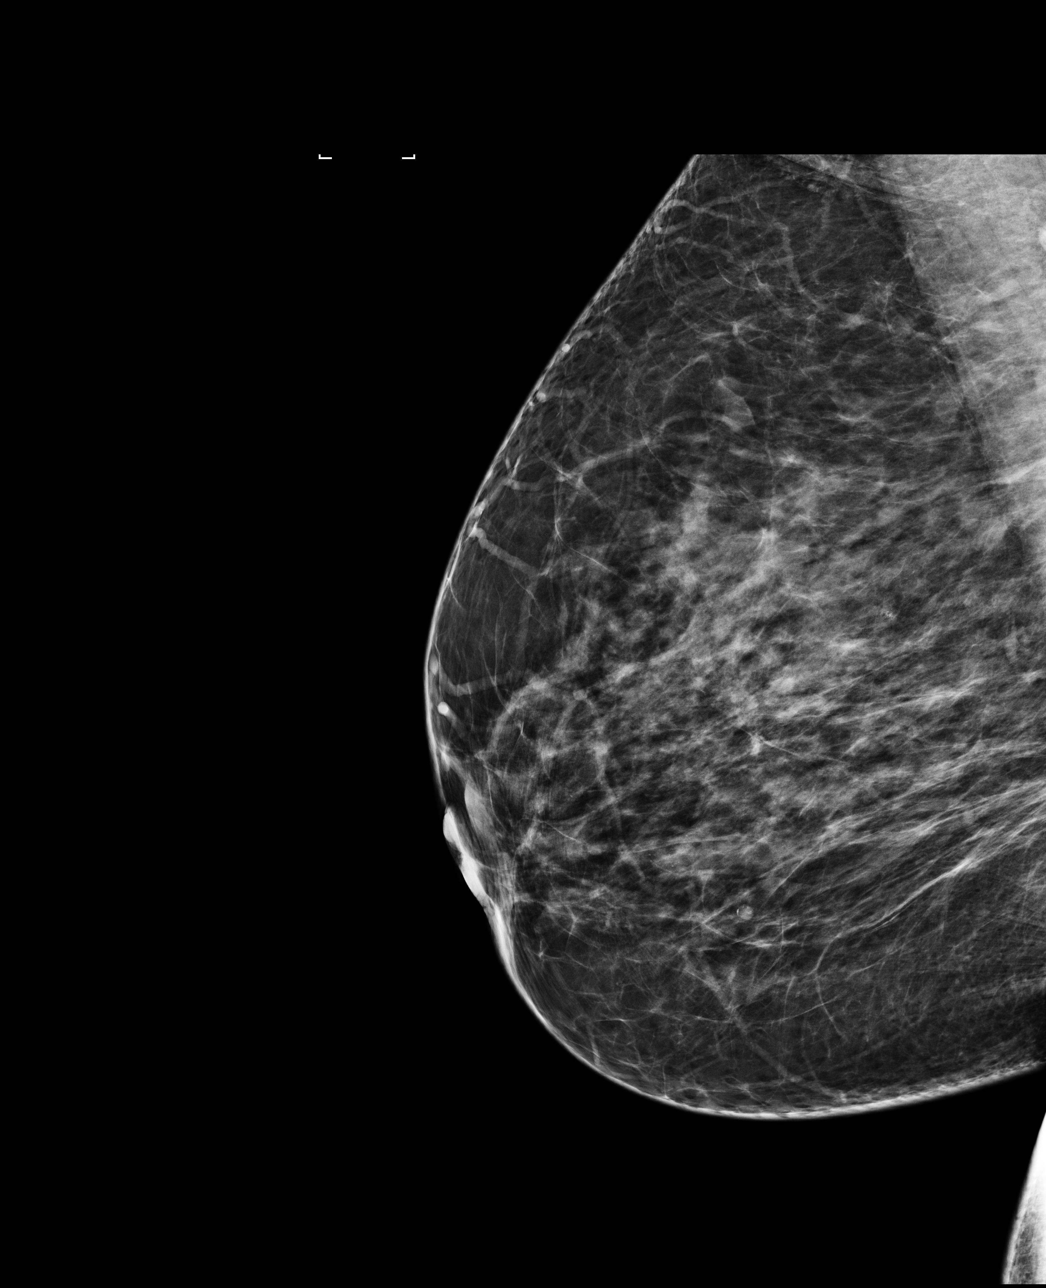

[L MLO]
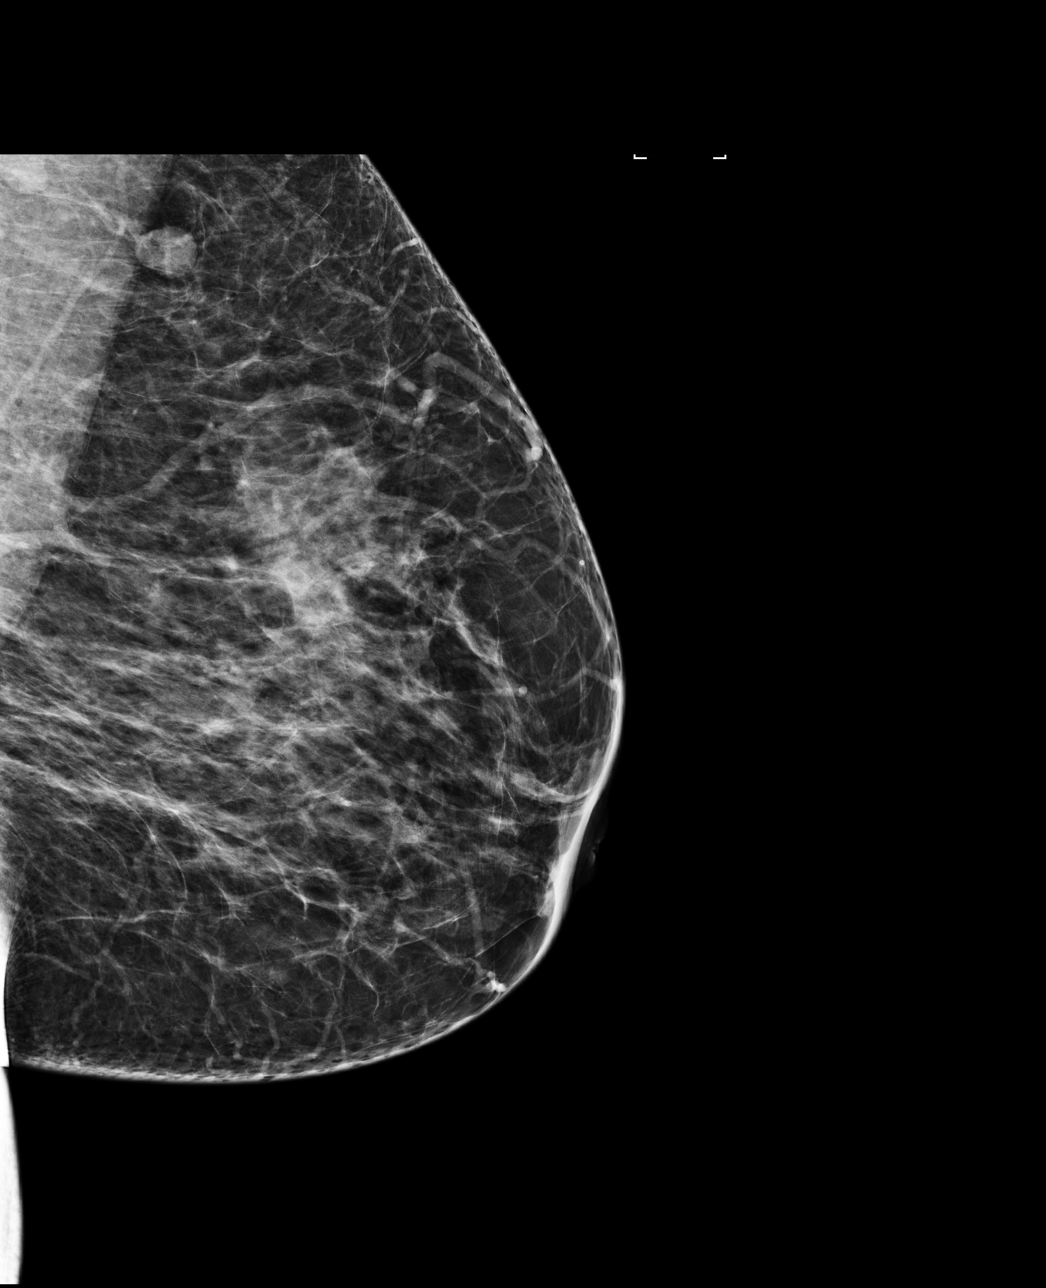

[L CC]
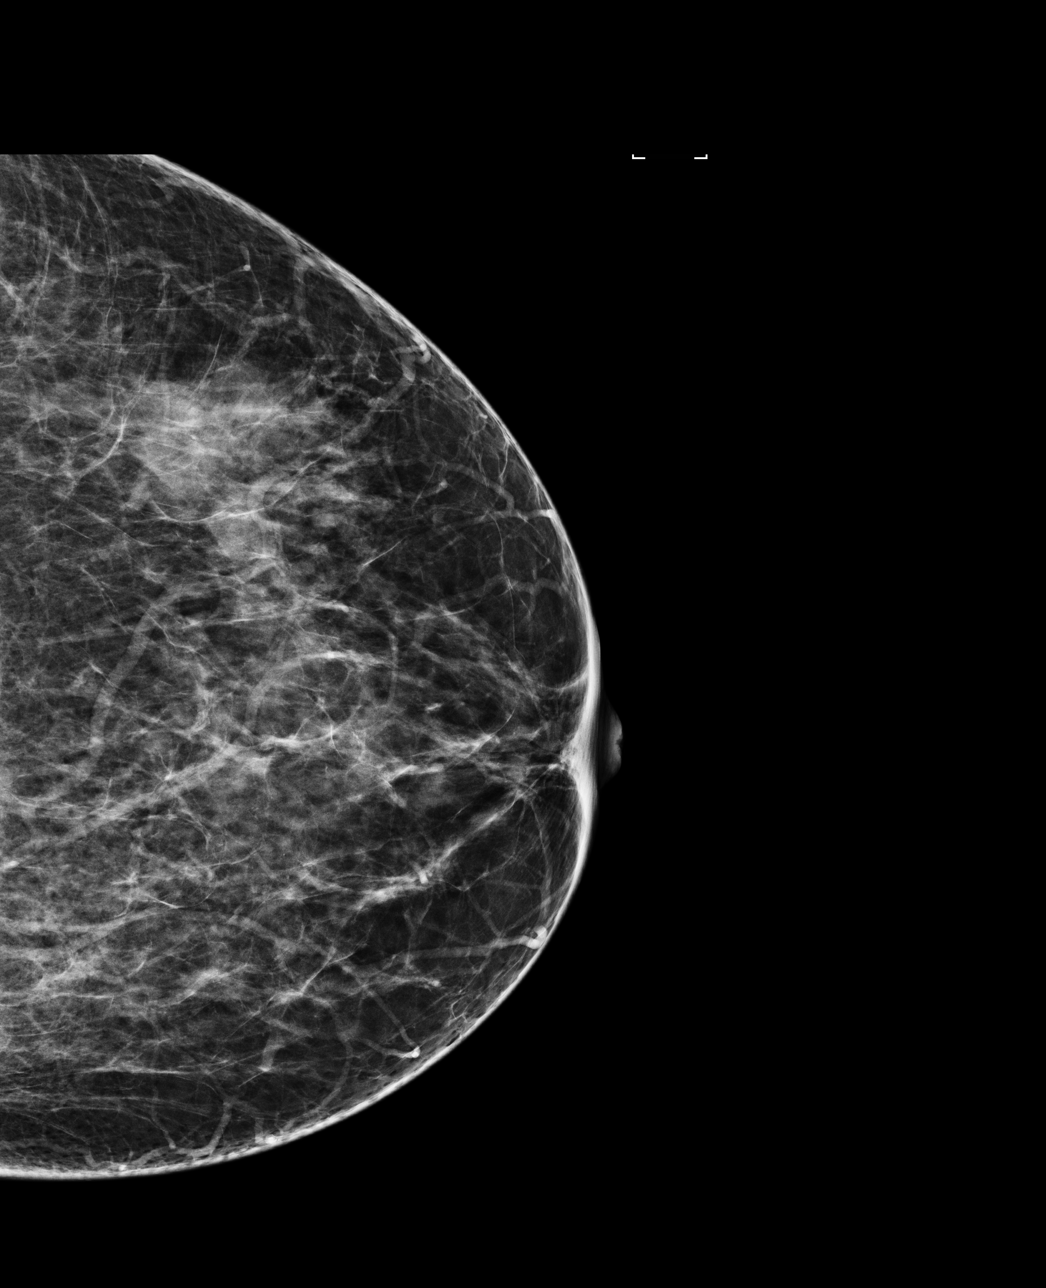

[4 of 4 positions shown; findings below may reference images not displayed]

ACR Breast Density Category b: There are scattered areas of
fibroglandular density.
FINDINGS: There are no findings suspicious for malignancy. Images were
processed with CAD.
IMPRESSION: No mammographic evidence of malignancy. A result letter of this
screening mammogram will be mailed directly to the patient.

RECOMMENDATION:
Screening mammogram in one year. (Code:[id number])

BI-RADS CATEGORY  1: Negative.

## 2018-08-24 HISTORY — PX: BREAST BIOPSY: SHX20

## 2018-09-05 ENCOUNTER — Other Ambulatory Visit: Payer: Self-pay | Admitting: Radiology

## 2019-12-22 DIAGNOSIS — C50919 Malignant neoplasm of unspecified site of unspecified female breast: Secondary | ICD-10-CM | POA: Insufficient documentation

## 2019-12-25 ENCOUNTER — Other Ambulatory Visit: Payer: Self-pay | Admitting: Obstetrics and Gynecology

## 2019-12-25 DIAGNOSIS — R928 Other abnormal and inconclusive findings on diagnostic imaging of breast: Secondary | ICD-10-CM

## 2020-01-09 DIAGNOSIS — D259 Leiomyoma of uterus, unspecified: Secondary | ICD-10-CM | POA: Insufficient documentation

## 2020-02-22 ENCOUNTER — Other Ambulatory Visit: Payer: Self-pay

## 2020-02-22 ENCOUNTER — Ambulatory Visit
Admission: RE | Admit: 2020-02-22 | Discharge: 2020-02-22 | Disposition: A | Discharge status: Home / Self Care | Payer: Managed Care, Other (non HMO) | Source: Ambulatory Visit | Attending: Obstetrics and Gynecology | Admitting: Obstetrics and Gynecology

## 2020-02-22 ENCOUNTER — Other Ambulatory Visit: Payer: Self-pay | Admitting: Obstetrics and Gynecology

## 2020-02-22 DIAGNOSIS — R921 Mammographic calcification found on diagnostic imaging of breast: Secondary | ICD-10-CM

## 2020-02-22 DIAGNOSIS — R928 Other abnormal and inconclusive findings on diagnostic imaging of breast: Secondary | ICD-10-CM

## 2020-08-27 ENCOUNTER — Other Ambulatory Visit: Payer: Self-pay

## 2020-08-27 ENCOUNTER — Ambulatory Visit
Admission: RE | Admit: 2020-08-27 | Discharge: 2020-08-27 | Disposition: A | Discharge status: Home / Self Care | Payer: No Typology Code available for payment source | Source: Ambulatory Visit | Attending: Obstetrics and Gynecology | Admitting: Obstetrics and Gynecology

## 2020-08-27 DIAGNOSIS — R921 Mammographic calcification found on diagnostic imaging of breast: Secondary | ICD-10-CM

## 2020-09-13 ENCOUNTER — Encounter (HOSPITAL_COMMUNITY): Payer: Self-pay | Admitting: Family Medicine

## 2020-09-13 ENCOUNTER — Other Ambulatory Visit: Payer: Self-pay

## 2020-09-13 ENCOUNTER — Ambulatory Visit (INDEPENDENT_AMBULATORY_CARE_PROVIDER_SITE_OTHER): Payer: No Typology Code available for payment source

## 2020-09-13 ENCOUNTER — Ambulatory Visit (HOSPITAL_COMMUNITY)
Admission: EM | Admit: 2020-09-13 | Discharge: 2020-09-13 | Disposition: A | Discharge status: Home / Self Care | Payer: No Typology Code available for payment source | Attending: Family Medicine | Admitting: Family Medicine

## 2020-09-13 DIAGNOSIS — S6982XA Other specified injuries of left wrist, hand and finger(s), initial encounter: Secondary | ICD-10-CM | POA: Diagnosis not present

## 2020-09-13 DIAGNOSIS — M79642 Pain in left hand: Secondary | ICD-10-CM

## 2020-09-13 DIAGNOSIS — S63502A Unspecified sprain of left wrist, initial encounter: Secondary | ICD-10-CM | POA: Diagnosis not present

## 2020-09-13 DIAGNOSIS — Y92009 Unspecified place in unspecified non-institutional (private) residence as the place of occurrence of the external cause: Secondary | ICD-10-CM

## 2020-09-13 DIAGNOSIS — W19XXXA Unspecified fall, initial encounter: Secondary | ICD-10-CM | POA: Diagnosis not present

## 2020-09-13 NOTE — Discharge Instructions (Signed)
You can take ibuprofen over-the-counter 3 tablets which equals 600 mg every 8 hours as needed for wrist and side pain.  For your wrist I recommend applying ice over the course of the next 2 days and elevating on a pillow to reduce swelling.  While doing any activity involving your wrist wear splint to prevent 3 injury and to promote comfort.  You may discontinue use of splint when wrist pain resolves. If symptoms worsen or if you develop any numbness and tingling or weakness involving the wrist or left hand follow-up with orthopedics I have included their contact information on your discharge summary

## 2020-09-13 NOTE — ED Provider Notes (Signed)
[place]    CSN: [id number] Arrival date & time: 09/13/20  1938      History   Chief Complaint Chief Complaint  Patient presents with  . Fall  . Wrist Pain    HPI [place] POZZI is a 45 y.o. female.   HPI Patient presents today with a left wrist and left side injury following a slip and fall injury at her home in which she fell down her steps.  She reports immediately experiencing some pain and dosed herself with ibuprofen.  She continued to have pain today and presents today to urgent care for imaging to rule out an acute fracture.  Is any numbness or tingling involving her fingers. Thinks that she twisted or bent the wrist.  No prior fractures involving the left extremity.  Did not lose consciousness. Past Medical History:  Diagnosis Date  . Migraines    otc med prn    Patient Active Problem List   Diagnosis Date Noted  . S/P hysterectomy 03/18/2017  . Rash and nonspecific skin eruption 08/13/2016  . Menorrhagia with regular cycle 12/21/2015  . Thyromegaly 12/21/2015  . Well adult exam 06/19/2014  . Vaginal discharge 06/19/2014  . Paresthesia 06/19/2014  . Dermatitis 06/19/2014  . FUNGAL INFECTION 07/05/2008  . OVERWEIGHT 06/02/2007  . ALLERGIC RHINITIS 06/02/2007  . ECZEMA, ATOPIC DERMATITIS 10/21/2006    Past Surgical History:  Procedure Laterality Date  . ABDOMINAL HYSTERECTOMY N/A 03/18/2017   Procedure: HYSTERECTOMY ABDOMINAL;  Surgeon: Tyson Dense, MD;  Location: [facility];  Service: Gynecology;  Laterality: N/A;  Rex Bickley coming from [employer] for lighted retractor. One time use approved with no cost to patient.  [name] BILATERAL SALPINGECTOMY  03/18/2017   Procedure: BILATERAL SALPINGECTOMY;  Surgeon: Tyson Dense, MD;  Location: [facility];  Service: Gynecology;;  . BREAST BIOPSY Right 2020  . CESAREAN SECTION  1993  . CYSTOSCOPY N/A 03/18/2017   Procedure: CYSTOSCOPY;  Surgeon: Tyson Dense, MD;  Location: [facility];  Service:  Gynecology;  Laterality: N/A;  . HEMORRHOID SURGERY    . REPEAT CESAREAN SECTION  1991  . WISDOM TOOTH EXTRACTION      OB History   No obstetric history on file.      Home Medications    Prior to Admission medications   Medication Sig Start Date End Date Taking? Authorizing Provider  docusate sodium (COLACE) 100 MG capsule Take 1 capsule (100 mg total) by mouth 2 (two) times daily. 03/20/17   Tyson Dense, MD  ferrous sulfate 325 (65 FE) MG EC tablet Take 1 tablet (325 mg total) by mouth 2 (two) times daily. 03/20/17   Tyson Dense, MD  fluocinonide (LIDEX) 0.05 % external solution Apply 1 application topically 2 (two) times daily as needed (for skin irritation/eczema.).    [provider]  ibuprofen (ADVIL,MOTRIN) 600 MG tablet Take 1 tablet (600 mg total) by mouth every 6 (six) hours as needed. 03/20/17   Tyson Dense, MD  triamcinolone cream (KENALOG) 0.1 % Apply topically 2 (two) times daily as needed. Patient taking differently: Apply 1 application topically 2 (two) times daily as needed (for ezcema.). 11/26/16   Leeanne Rio, MD    Family History History reviewed. No pertinent family history.  Social History Social History   Tobacco Use  . Smoking status: Former Smoker    Packs/day: 0.25    Types: Cigarettes    Quit date: 06/16/2007    Years since quitting: 13.2  .  Smokeless tobacco: Never Used  Vaping Use  . Vaping Use: Never used  Substance Use Topics  . Alcohol use: Yes    Comment: occasionally  . Drug use: No     Allergies   Bacitracin   Review of Systems Review of Systems Pertinent negatives listed in HPI  Physical Exam Triage Vital Signs ED Triage Vitals  Enc Vitals Group     BP 09/13/20 1957 135/80     Pulse Rate 09/13/20 1957 79     Resp 09/13/20 1957 18     Temp 09/13/20 1957 97.8 F (36.6 C)     Temp Source 09/13/20 1957 Oral     SpO2 09/13/20 1957 100 %     Weight --      Height --      Head  Circumference --      Peak Flow --      Pain Score 09/13/20 1952 (P) 6     Pain Loc --      Pain Edu? --      Excl. in [place]? --    No data found.  Updated Vital Signs BP 135/80 (BP Location: Left Arm)   Pulse 79   Temp 97.8 F (36.6 C) (Oral)   Resp 18   SpO2 100%   Visual Acuity Right Eye Distance:   Left Eye Distance:   Bilateral Distance:    Right Eye Near:   Left Eye Near:    Bilateral Near:     Physical Exam General appearance: alert, well developed, well nourished, cooperative and in no distress Head: Normocephalic, without obvious abnormality, atraumatic Respiratory: Respirations even and unlabored, normal respiratory rate Heart: rate and rhythm normal. No gallop or murmurs noted on exam  Extremities: Left hand and wrist/left wrist edema without ecchymosis, tenderness with movement, strength 5 out of 5. No obvious deformity noted.  Skin: Skin color, texture, turgor normal. No rashes seen  Psych: Appropriate mood and affect. Neurologic: Mental status: Alert, oriented to person, place, and time, thought content appropriate.   UC Treatments / Results  Labs (all labs ordered are listed, but only abnormal results are displayed) Labs Reviewed - No data to display  EKG   Radiology DG Wrist Complete Left  Result Date: 09/13/2020 CLINICAL DATA:  [place], left wrist and hand injury EXAM: LEFT HAND - COMPLETE 3+ VIEW; LEFT WRIST - COMPLETE 3+ VIEW COMPARISON:  None. FINDINGS: Left wrist: Frontal, oblique, lateral, and ulnar deviated views are obtained. No fracture, subluxation, or dislocation. Joint spaces are well preserved. Soft tissues are normal. Left hand: Frontal, oblique, lateral views are obtained. No acute fracture, subluxation, or dislocation. Joint spaces are well preserved. Soft tissues are normal. IMPRESSION: 1. Unremarkable left hand and left wrist.  No acute fracture. Electronically Signed   By: Randa Ngo M.D.   On: 09/13/2020 20:17   DG Hand Complete  Left  Result Date: 09/13/2020 CLINICAL DATA:  [place], left wrist and hand injury EXAM: LEFT HAND - COMPLETE 3+ VIEW; LEFT WRIST - COMPLETE 3+ VIEW COMPARISON:  None. FINDINGS: Left wrist: Frontal, oblique, lateral, and ulnar deviated views are obtained. No fracture, subluxation, or dislocation. Joint spaces are well preserved. Soft tissues are normal. Left hand: Frontal, oblique, lateral views are obtained. No acute fracture, subluxation, or dislocation. Joint spaces are well preserved. Soft tissues are normal. IMPRESSION: 1. Unremarkable left hand and left wrist.  No acute fracture. Electronically Signed   By: Randa Ngo M.D.   On: 09/13/2020 20:17  Procedures Procedures (including critical care time)  Medications Ordered in UC Medications - No data to display  Initial Impression / Assessment and Plan / UC Course  I have reviewed the triage vital signs and the nursing notes.  Pertinent labs & imaging results that were available during my care of the patient were reviewed by me and considered in my medical decision making (see chart for details).     Imaging of the left hand and left wrist are both negative for any acute fracture.  Placed in a spica splint for comfort and to prevent reinjury.  RICE management warranted to reduce swelling and inflammation related to injury.  DC spica splint whenever symptoms resolve.  Continue ibuprofen OTC 600 mg every 8 hours as needed for pain.  Orthopedic follow-up if symptoms worsen or do not improve.  Final Clinical Impressions(s) / UC Diagnoses   Final diagnoses:  Sprain of left wrist, initial encounter  Fall in home, initial encounter     Discharge Instructions     You can take ibuprofen over-the-counter 3 tablets which equals 600 mg every 8 hours as needed for wrist and side pain.  For your wrist I recommend applying ice over the course of the next 2 days and elevating on a pillow to reduce swelling.  While doing any activity involving your  wrist wear splint to prevent 3 injury and to promote comfort.  You may discontinue use of splint when wrist pain resolves. If symptoms worsen or if you develop any numbness and tingling or weakness involving the wrist or left hand follow-up with orthopedics I have included their contact information on your discharge summary   ED Prescriptions    None     PDMP not reviewed this encounter.   Amayra, [place]ied[place], [place] 09/13/20 2032

## 2021-04-14 ENCOUNTER — Encounter: Payer: Self-pay | Admitting: Podiatry

## 2021-04-14 ENCOUNTER — Ambulatory Visit (INDEPENDENT_AMBULATORY_CARE_PROVIDER_SITE_OTHER): Payer: No Typology Code available for payment source | Admitting: Podiatry

## 2021-04-14 ENCOUNTER — Other Ambulatory Visit: Payer: Self-pay

## 2021-04-14 DIAGNOSIS — B353 Tinea pedis: Secondary | ICD-10-CM

## 2021-04-14 DIAGNOSIS — L988 Other specified disorders of the skin and subcutaneous tissue: Secondary | ICD-10-CM

## 2021-04-14 MED ORDER — TERBINAFINE HCL 250 MG PO TABS: 250.0000 mg | ORAL_TABLET | Freq: Every day | ORAL | 0 refills | Status: AC

## 2021-04-14 MED ORDER — KETOCONAZOLE 2 % EX CREA: 1.0000 "application " | TOPICAL_CREAM | Freq: Every day | CUTANEOUS | 0 refills | Status: AC

## 2021-04-14 NOTE — Patient Instructions (Signed)
Make sure you dry thoroughly in between your toes daily. Use an antiperspirant spray on your foot to help with the moisture. You can also soak your foot in some warm water with a splash of apple cider vinegar.  Terbinafine tablets What is this medication? TERBINAFINE (TER bin a feen) is an antifungal medicine. It is used to treatcertain kinds of fungal or yeast infections. This medicine may be used for other purposes; ask your health care provider orpharmacist if you have questions. COMMON BRAND NAME(S): Lamisil, Terbinex What should I tell my care team before I take this medication? They need to know if you have any of these conditions: drink alcoholic beverages kidney disease liver disease an unusual or allergic reaction to terbinafine, other medicines, foods, dyes, or preservatives pregnant or trying to get pregnant breast-feeding How should I use this medication? Take this medicine by mouth with a full glass of water. Follow the directions on the prescription label. You can take this medicine with food or on an empty stomach. Take your medicine at regular intervals. Do not take your medicine more often than directed. Do not skip doses or stop your medicine early even ifyou feel better. Do not stop taking except on your doctor's advice. A special MedGuide will be given to you by the pharmacist with eachprescription and refill. Be sure to read this information carefully each time. Talk to your pediatrician regarding the use of this medicine in children.Special care may be needed. Overdosage: If you think you have taken too much of this medicine contact apoison control center or emergency room at once. NOTE: This medicine is only for you. Do not share this medicine with others. What if I miss a dose? If you miss a dose, take it as soon as you can. If it is almost time for yournext dose, take only that dose. Do not take double or extra doses. What may interact with this medication? Do not  take this medicine with any of the following medications: thioridazine This medicine may also interact with the following medications: beta-blockers caffeine cimetidine cyclosporine medicines for depression, anxiety, or psychotic disturbances medicines for fungal infections like fluconazole and ketoconazole medicines for irregular heartbeat like amiodarone, flecainide and propafenone rifampin warfarin This list may not describe all possible interactions. Give your health care provider a list of all the medicines, herbs, non-prescription drugs, or dietary supplements you use. Also tell them if you smoke, drink alcohol, or use illegaldrugs. Some items may interact with your medicine. What should I watch for while using this medication? Visit your doctor or health care provider regularly. Tell your doctor right away if you have nausea or vomiting, loss of appetite, stomach pain on your right upper side, yellow skin, dark urine, light stools, or are over tired. Some fungal infections need many weeks or months of treatment to cure. If you are taking this medicine for a long time, you will need to have important bloodwork done. This medicine may cause serious skin reactions. They can happen weeks to months after starting the medicine. Contact your health care provider right away if you notice fevers or flu-like symptoms with a rash. The rash may be red or purple and then turn into blisters or peeling of the skin. Or, you might notice a red rash with swelling of the face, lips or lymph nodes in your neck or underyour arms. What side effects may I notice from receiving this medication? Side effects that you should report to your doctor or health care  professionalas soon as possible: allergic reactions like skin rash or hives, swelling of the face, lips, or tongue changes in vision dark urine fever or infection general ill feeling or flu-like symptoms light-colored stools loss of appetite,  nausea rash, fever, and swollen lymph nodes redness, blistering, peeling or loosening of the skin, including inside the mouth right upper belly pain unusually weak or tired yellowing of the eyes or skin Side effects that usually do not require medical attention (report to yourdoctor or health care professional if they continue or are bothersome): changes in taste diarrhea hair loss muscle or joint pain stomach gas stomach upset This list may not describe all possible side effects. Call your doctor for medical advice about side effects. You may report side effects to FDA at1-800-FDA-1088. Where should I keep my medication? Keep out of the reach of children. Store at room temperature below 25 degrees C (77 degrees F). Protect fromlight. Throw away any unused medicine after the expiration date. NOTE: This sheet is a summary. It may not cover all possible information. If you have questions about this medicine, talk to your doctor, pharmacist, orhealth care provider.  2022 Elsevier/Gold Standard (2018-11-18 15:37:07)

## 2021-04-21 NOTE — Progress Notes (Signed)
Subjective:   Patient ID: Erica Burnett, female   DOB: 45 y.o.   MRN: [id number]   HPI 45 year old female presents the office today for concerns of fungus on her skin in between her toes with left side worse than the right.  Is been getting worse over last 1 month and she states that she does get itchy and she scratches at it.  She states that the skin gets moist and cracked and also dry at times.  She tried over-the-counter Lotrisone not significant improvement.  She also states that her feet do sweat.   Review of Systems  All other systems reviewed and are negative.  Past Medical History:  Diagnosis Date   Migraines    otc med prn    Past Surgical History:  Procedure Laterality Date   ABDOMINAL HYSTERECTOMY N/A 03/18/2017   Procedure: HYSTERECTOMY ABDOMINAL;  Surgeon: Tyson Dense, MD;  Location: [facility];  Service: Gynecology;  Laterality: N/A;  Rex Bickley coming from [place] for lighted retractor. One time use approved with no cost to patient.   BILATERAL SALPINGECTOMY  03/18/2017   Procedure: BILATERAL SALPINGECTOMY;  Surgeon: Tyson Dense, MD;  Location: [facility];  Service: Gynecology;;   BREAST BIOPSY Right 2020   [place] N/A 03/18/2017   Procedure: CYSTOSCOPY;  Surgeon: Tyson Dense, MD;  Location: [facility];  Service: Gynecology;  Laterality: N/A;   HEMORRHOID SURGERY     REPEAT CESAREAN SECTION  1991   WISDOM TOOTH EXTRACTION       Current Outpatient Medications:    ketoconazole (NIZORAL) 2 % cream, Apply 1 application topically daily., Disp: 60 g, Rfl: 0   terbinafine (LAMISIL) 250 MG tablet, Take 1 tablet (250 mg total) by mouth daily., Disp: 14 tablet, Rfl: 0   triamcinolone cream (KENALOG) 0.1 %, Apply topically 2 (two) times daily as needed. (Patient taking differently: Apply 1 application topically 2 (two) times daily as needed (for ezcema.).), Disp: 30 g, Rfl: 0  Allergies  Allergen Reactions   Bacitracin Hives  and Swelling    Facial hives/scalp oozing/skin hot to touch         Objective:  Physical Exam  General: AAO x3, NAD  Dermatological: Mild amount of interdigital maceration noted most notably along the left third interspace.  Also interdigital tinea pedis dry, scaly, erythematous skin noticed as well as the plantar aspect of the foot.  There is no edema, drainage or pus or any other signs of infection.  No open lesions.  Vascular: Dorsalis Pedis artery and Posterior Tibial artery pedal pulses are 2/4 bilateral with immedate capillary fill time. There is no pain with calf compression, swelling, warmth, erythema.   Neruologic: Grossly intact via light touch bilateral.   Musculoskeletal: No gross boney pedal deformities bilateral. No pain, crepitus, or limitation noted with foot and ankle range of motion bilateral. Muscular strength 5/5 in all groups tested bilateral.  Gait: Unassisted, Nonantalgic.       Assessment:   Tinea pedis     Plan:  -Treatment options discussed including all alternatives, risks, and complications -Etiology of symptoms were discussed -Prescribed a 2-week course of Lamisil.  Also prescribed ketoconazole cream for the skin.  Discussed drying thoroughly in between her toes.  She can also soak her feet in warm water to splash vinegar.  Discussed antiperspirant spray to help with the sweat.  Discussed changing shoes and socks on a regular basis as well.  Trula Slade DPM

## 2021-05-26 ENCOUNTER — Ambulatory Visit: Payer: No Typology Code available for payment source | Admitting: Podiatry

## 2021-12-06 ENCOUNTER — Other Ambulatory Visit: Payer: Self-pay

## 2021-12-06 ENCOUNTER — Encounter (HOSPITAL_COMMUNITY): Payer: Self-pay

## 2021-12-06 ENCOUNTER — Ambulatory Visit (HOSPITAL_COMMUNITY)
Admission: EM | Admit: 2021-12-06 | Discharge: 2021-12-06 | Disposition: A | Discharge status: Home / Self Care | Payer: No Typology Code available for payment source

## 2021-12-06 DIAGNOSIS — T23252A Burn of second degree of left palm, initial encounter: Secondary | ICD-10-CM | POA: Diagnosis not present

## 2021-12-06 NOTE — ED Triage Notes (Signed)
Pt reports burning her left hand on the stove top this morning.  ?

## 2021-12-06 NOTE — Discharge Instructions (Addendum)
Continue using cooling measures today, elevate hand.  Use topical Neosporin and light dressing.  Return in 2 to 3 days for possible debridement and recheck of your wounds. ?
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
## 2023-08-08 ENCOUNTER — Ambulatory Visit (INDEPENDENT_AMBULATORY_CARE_PROVIDER_SITE_OTHER): Payer: No Typology Code available for payment source

## 2023-08-08 ENCOUNTER — Ambulatory Visit (HOSPITAL_COMMUNITY)
Admission: EM | Admit: 2023-08-08 | Discharge: 2023-08-08 | Disposition: A | Discharge status: Home / Self Care | Payer: No Typology Code available for payment source | Attending: Emergency Medicine | Admitting: Emergency Medicine

## 2023-08-08 ENCOUNTER — Other Ambulatory Visit: Payer: Self-pay

## 2023-08-08 ENCOUNTER — Encounter (HOSPITAL_COMMUNITY): Payer: Self-pay | Admitting: Emergency Medicine

## 2023-08-08 DIAGNOSIS — M7662 Achilles tendinitis, left leg: Secondary | ICD-10-CM | POA: Diagnosis not present

## 2023-08-08 DIAGNOSIS — M7732 Calcaneal spur, left foot: Secondary | ICD-10-CM | POA: Diagnosis not present

## 2023-08-08 MED ORDER — IBUPROFEN 800 MG PO TABS: 800.0000 mg | ORAL_TABLET | Freq: Three times a day (TID) | ORAL | 0 refills | Status: AC

## 2023-08-08 NOTE — Discharge Instructions (Addendum)
Your imaging did not show any acute fracture or deformities.  You do have a dorsal heel spur.  This may be exacerbated by your Achilles tendinitis.  Please rest, ice and stop activities that are hurting the heel spur.  You can take 800 mg of ibuprofen up to 3 times daily with food for pain and inflammation.  I suggest following up with an orthopedic within the next week or so for reevaluation.

## 2023-08-08 NOTE — ED Triage Notes (Signed)
Symptoms started 1 to 1 1/2 weeks ago.  Initially thought she did something while working out.    This week noticed increase pain and swelling to left ankle.   Patient has a history of fracture to this ankle.  And has sprained this ankle approx one year ago.   Patient reports pain to heel and Achilles area.  Patient is not taking any medicines to help with pain

## 2023-08-08 NOTE — ED Provider Notes (Signed)
[facility]    CSN: [id number] Arrival date & time: 08/08/23  1101      History   Chief Complaint Chief Complaint  Patient presents with   Foot Pain    HPI Erica Burnett is a 47 y.o. female.   Patient presents to clinic for left heel pain.  Symptoms started about 7 to 10 days ago.  Initially she thought maybe she injured it while working out.  She was having pain with walking and dorsiflexion.  Feels as if hide booties help give her some more support.  She did try compression sleeve and this did not really help.  She came into clinic today because she noticed some swelling to the heel and ankle area.  She did sprained this ankle about a year ago.  She has a old fracture to this ankle.  She has not tried any medications to help with her pain.  The history is provided by the patient and medical records.  Foot Pain    Past Medical History:  Diagnosis Date   Migraines    otc med prn    Patient Active Problem List   Diagnosis Date Noted   Uterine leiomyoma 01/09/2020   Malignant tumor of breast (HCC) 12/22/2019   S/P hysterectomy 03/18/2017   Rash and nonspecific skin eruption 08/13/2016   Menorrhagia with regular cycle 12/21/2015   Thyromegaly 12/21/2015   Well adult exam 06/19/2014   Vaginal discharge 06/19/2014   Paresthesia 06/19/2014   Dermatitis 06/19/2014   FUNGAL INFECTION 07/05/2008   OVERWEIGHT 06/02/2007   ALLERGIC RHINITIS 06/02/2007   ECZEMA, ATOPIC DERMATITIS 10/21/2006    Past Surgical History:  Procedure Laterality Date   ABDOMINAL HYSTERECTOMY N/A 03/18/2017   Procedure: HYSTERECTOMY ABDOMINAL;  Surgeon: Ranae Pila, MD;  Location: [facility];  Service: Gynecology;  Laterality: N/A;  Rex Bickley coming from [employer] for lighted retractor. One time use approved with no cost to patient.   BILATERAL SALPINGECTOMY  03/18/2017   Procedure: BILATERAL SALPINGECTOMY;  Surgeon: Ranae Pila, MD;  Location: [facility];  Service:  Gynecology;;   BREAST BIOPSY Right 2020   CESAREAN SECTION  1993   CYSTOSCOPY N/A 03/18/2017   Procedure: CYSTOSCOPY;  Surgeon: Ranae Pila, MD;  Location: [facility];  Service: Gynecology;  Laterality: N/A;   HEMORRHOID SURGERY     REPEAT CESAREAN SECTION  1991   WISDOM TOOTH EXTRACTION      OB History   No obstetric history on file.      Home Medications    Prior to Admission medications   Medication Sig Start Date End Date Taking? Authorizing Provider  ibuprofen (ADVIL) 800 MG tablet Take 1 tablet (800 mg total) by mouth 3 (three) times daily. 08/08/23  Yes Rinaldo Ratel, [place], FNP  ketoconazole (NIZORAL) 2 % cream Apply 1 application topically daily. Patient not taking: Reported on 08/08/2023 04/14/21   Vivi Barrack, DPM  terbinafine (LAMISIL) 250 MG tablet Take 1 tablet (250 mg total) by mouth daily. Patient not taking: Reported on 08/08/2023 04/14/21   Vivi Barrack, DPM  triamcinolone cream (KENALOG) 0.1 % Apply topically 2 (two) times daily as needed. Patient not taking: Reported on 08/08/2023 11/26/16   Latrelle Dodrill, MD    Family History History reviewed. No pertinent family history.  Social History Social History   Tobacco Use   Smoking status: Former    Current packs/day: 0.00    Types: Cigarettes    Quit date: 06/16/2007  Years since quitting: 16.1   Smokeless tobacco: Never  Vaping Use   Vaping status: Never Used  Substance Use Topics   Alcohol use: Yes    Comment: occasionally   Drug use: No     Allergies   Bacitracin   Review of Systems Review of Systems  Per HPI   Physical Exam Triage Vital Signs ED Triage Vitals  Encounter Vitals Group     BP 08/08/23 1214 129/86     Systolic BP Percentile --      Diastolic BP Percentile --      Pulse Rate 08/08/23 1214 88     Resp 08/08/23 1214 18     Temp 08/08/23 1214 98.2 F (36.8 C)     Temp Source 08/08/23 1214 Oral     SpO2 08/08/23 1214 97 %     Weight --       Height --      Head Circumference --      Peak Flow --      Pain Score 08/08/23 1212 8     Pain Loc --      Pain Education --      Exclude from Growth Chart --    No data found.  Updated Vital Signs BP 129/86 (BP Location: Right Arm) Comment: large cuff  Pulse 88   Temp 98.2 F (36.8 C) (Oral)   Resp 18   LMP 03/02/2017 (Approximate)   SpO2 97%   Visual Acuity Right Eye Distance:   Left Eye Distance:   Bilateral Distance:    Right Eye Near:   Left Eye Near:    Bilateral Near:     Physical Exam Vitals and nursing note reviewed.  Constitutional:      Appearance: Normal appearance.  HENT:     Head: Normocephalic and atraumatic.     Right Ear: External ear normal.     Left Ear: External ear normal.     Nose: Nose normal.     Mouth/Throat:     Mouth: Mucous membranes are moist.  Eyes:     Conjunctiva/sclera: Conjunctivae normal.  Cardiovascular:     Rate and Rhythm: Normal rate.     Pulses: Normal pulses.     Heart sounds: No murmur heard. Musculoskeletal:        General: Normal range of motion.     Left ankle:     Left Achilles Tendon: Thompson's test negative.     Left foot: Bony tenderness present.     Comments: TTP over posterior left heel. Mild soft tissue swelling.  Pedal pulses are 2+.  Brisk capillary refill.  Range of motion intact.  Dorsi flexion causes pain to Achilles insertion site  Skin:    General: Skin is warm and dry.  Neurological:     General: No focal deficit present.     Mental Status: She is alert and oriented to person, place, and time.  Psychiatric:        Mood and Affect: Mood normal.        Behavior: Behavior normal. Behavior is cooperative.      UC Treatments / Results  Labs (all labs ordered are listed, but only abnormal results are displayed) Labs Reviewed - No data to display  EKG   Radiology DG Ankle Complete Left Result Date: 08/08/2023 CLINICAL DATA:  Heel pain EXAM: LEFT ANKLE COMPLETE - 3+ VIEW COMPARISON:  None  Available. FINDINGS: No evidence of acute fracture. No dislocation. Old healed fracture deformity of the  distal fibula and evidence of prior injury to the distal tibiofibular syndesmosis. Prominent calcifications along the course of the distal Achilles tendon. No focal soft tissue swelling. IMPRESSION: 1. No acute fracture or dislocation of the left ankle. 2. Prominent calcifications along the course of the distal Achilles tendon, suggesting underlying tendinopathy. Electronically Signed   By: Duanne Guess D.O.   On: 08/08/2023 13:00    Procedures Procedures (including critical care time)  Medications Ordered in UC Medications - No data to display  Initial Impression / Assessment and Plan / UC Course  I have reviewed the triage vital signs and the nursing notes.  Pertinent labs & imaging results that were available during my care of the patient were reviewed by me and considered in my medical decision making (see chart for details).  Vitals and triage reviewed, patient is hemodynamically stable. Left posterior heel is TTP over achillis insertion site.  Responsive to [facility] test.  Atraumatic, but with history of previous fractures.  Range of motion intact, pain with dorsiflexion.  Pedal pulses 2+ and brisk capillary refill.  Imaging confirms suspicion of Achilles tendinopathy with a dorsal spur.  Symptomatic management anti-inflammatories discussed.  Ortho follow-up as needed.  Plan of care, follow-up care return precautions given, no questions at this time.     Final Clinical Impressions(s) / UC Diagnoses   Final diagnoses:  Heel spur, left  Achilles tendinitis of left lower extremity     Discharge Instructions      Your imaging did not show any acute fracture or deformities.  You do have a dorsal heel spur.  This may be exacerbated by your Achilles tendinitis.  Please rest, ice and stop activities that are hurting the heel spur.  You can take 800 mg of ibuprofen up to 3 times  daily with food for pain and inflammation.  I suggest following up with an orthopedic within the next week or so for reevaluation.      ED Prescriptions     Medication Sig Dispense Auth. Provider   ibuprofen (ADVIL) 800 MG tablet Take 1 tablet (800 mg total) by mouth 3 (three) times daily. 21 tablet Shauniece Kwan, [place], [place]      PDMP not reviewed this encounter.   Babak Lucus, [place] N, [place] 08/08/23 1310

## 2024-03-10 ENCOUNTER — Encounter: Payer: Self-pay | Admitting: Advanced Practice Midwife

## 2024-04-20 ENCOUNTER — Ambulatory Visit

## 2024-04-20 ENCOUNTER — Ambulatory Visit (INDEPENDENT_AMBULATORY_CARE_PROVIDER_SITE_OTHER)

## 2024-04-20 ENCOUNTER — Ambulatory Visit (INDEPENDENT_AMBULATORY_CARE_PROVIDER_SITE_OTHER): Admitting: Podiatry

## 2024-04-20 ENCOUNTER — Encounter: Payer: Self-pay | Admitting: Podiatry

## 2024-04-20 DIAGNOSIS — M7752 Other enthesopathy of left foot: Secondary | ICD-10-CM | POA: Diagnosis not present

## 2024-04-20 DIAGNOSIS — M7732 Calcaneal spur, left foot: Secondary | ICD-10-CM

## 2024-04-20 DIAGNOSIS — M7662 Achilles tendinitis, left leg: Secondary | ICD-10-CM | POA: Diagnosis not present

## 2024-04-20 MED ORDER — MELOXICAM 15 MG PO TABS: 15.0000 mg | ORAL_TABLET | Freq: Every day | ORAL | 0 refills | Status: AC | PRN

## 2024-04-20 NOTE — Patient Instructions (Addendum)

## 2024-04-20 NOTE — Progress Notes (Signed)
 Subjective:  Patient ID: Quinten LELON Lesches, female    DOB: [birth year],  MRN: [id number]  Chief Complaint  Patient presents with   Foot Pain    Pt is here due to left foot and ankle pain, states she has been having pain in the foot for a while, this Tuesday ankle began hurting, stiff and swelling states no injury, back of the ankle had a knot, can't put a lot of pressure on it, walking on her toes, states she took some Advil  and kept foot elevated.    Discussed the use of AI scribe software for clinical note transcription with the patient, who gave verbal consent to proceed.  History of Present Illness FATISHA RABALAIS is a 48 year old female who presents with left foot and ankle pain and swelling.  The left foot and ankle have been problematic, with significant worsening on Tuesday, although some ongoing for many months.  This did lead to swelling and inability to bear weight. Advil  provides some relief, but symptoms worsen after activities like cooking. Resting with leg elevation reduces swelling.  Persistent tenderness is present at the back of the left ankle, especially in the morning when both ankles feel stiff. Stiffness improves with movement throughout the day. A previous left ankle fracture over twenty years ago may contribute to current issues.  Eight months ago, she experienced a fall down the steps, resulting in a wrist sprain and some ankle stiffness. An x-ray was performed to rule out further injury to the ankle.  Chronic puffiness is noted in both ankles, which she attributes to 'puffy little ankles' she has always had.      Objective:    Physical Exam General: AAO x3, NAD  Dermatological: Skin is warm, dry and supple bilateral. There are no open sores, no preulcerative lesions, no rash or signs of infection present.  Vascular: Dorsalis Pedis artery and Posterior Tibial artery pedal pulses are 2/4 bilateral with immedate capillary fill time. . There is no pain with calf  compression, swelling, warmth, erythema.   Neruologic: Grossly intact via light touch bilateral.   Musculoskeletal: There is tenderness palpation of the posterior aspect calcaneus along an area of a prominent spur.  Achilles tendon appears to be intact.  Thompson test is negative.  Slight edema of the posterior heel.  No erythema or warmth.  There is no area pinpoint tenderness otherwise.  Gait: Unassisted, Nonantalgic.     No images are attached to the encounter.    Results RADIOLOGY Left ankle/foot X-ray (04/20/2024): Calcification near Achilles tendon.  This was present on prior x-rays which appears to be larger as well.  Prior fibula fracture noted.   Assessment:   1. Tendonitis, Achilles, left   2. Heel spur, left      Plan:  Patient was evaluated and treated and all questions answered.  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 X-ray shows calcification larger than eight months ago. No tendon tear noted at this time. Likely due to chronic tightness and previous injury. Avoid steroid injections due to high risk. - Prescribed meloxicam  for inflammation. - Advised regular icing. - Provided calf and Achilles stretching exercises. - Issued night splint for dorsiflexion during rest. Static or dynamic ankle foot orthosis, including soft interface material, adjustable for fit, for positioning, may be used for minimal ambulation, prefabricated, off-the-shelf was dispensed on the billed date of service  - Provided gel cushion for  heel pressure relief. - Ensured proper footwear with arch support and temporary heel lift.   Return in about 6 weeks (around 06/01/2024), or if symptoms worsen or fail to improve.   Donnice JONELLE Fees DPM
